# Patient Record
Sex: Female | Born: 1975
Health system: Southern US, Community
[De-identification: ages and names within clinical notes are randomized; demographics above are authoritative.]

## PROBLEM LIST (undated history)

## (undated) HISTORY — PX: OTHER SURGICAL HISTORY: SHX169

---

## 1999-11-06 ENCOUNTER — Ambulatory Visit (HOSPITAL_COMMUNITY): Admission: RE | Admit: 1999-11-06 | Discharge: 1999-11-06 | Payer: Self-pay | Admitting: Gastroenterology

## 2004-10-08 ENCOUNTER — Ambulatory Visit: Payer: Self-pay | Admitting: Internal Medicine

## 2006-12-26 ENCOUNTER — Ambulatory Visit: Payer: Self-pay | Admitting: Internal Medicine

## 2007-04-07 ENCOUNTER — Ambulatory Visit: Payer: Self-pay | Admitting: Obstetrics and Gynecology

## 2007-04-13 ENCOUNTER — Ambulatory Visit: Payer: Self-pay | Admitting: Obstetrics and Gynecology

## 2009-10-05 ENCOUNTER — Ambulatory Visit: Payer: Self-pay | Admitting: Internal Medicine

## 2010-11-06 ENCOUNTER — Ambulatory Visit: Payer: Self-pay

## 2013-04-15 ENCOUNTER — Ambulatory Visit: Payer: Self-pay | Admitting: Internal Medicine

## 2014-10-05 DIAGNOSIS — B977 Papillomavirus as the cause of diseases classified elsewhere: Secondary | ICD-10-CM | POA: Insufficient documentation

## 2014-10-05 DIAGNOSIS — A6000 Herpesviral infection of urogenital system, unspecified: Secondary | ICD-10-CM | POA: Insufficient documentation

## 2016-02-14 ENCOUNTER — Ambulatory Visit (INDEPENDENT_AMBULATORY_CARE_PROVIDER_SITE_OTHER): Payer: No Typology Code available for payment source | Admitting: Podiatry

## 2016-02-14 ENCOUNTER — Encounter: Payer: Self-pay | Admitting: Podiatry

## 2016-02-14 ENCOUNTER — Ambulatory Visit (INDEPENDENT_AMBULATORY_CARE_PROVIDER_SITE_OTHER): Payer: No Typology Code available for payment source

## 2016-02-14 VITALS — BP 102/61 | HR 64 | Resp 16 | Ht 63.0 in | Wt 175.0 lb

## 2016-02-14 DIAGNOSIS — M722 Plantar fascial fibromatosis: Secondary | ICD-10-CM

## 2016-02-14 MED ORDER — METHYLPREDNISOLONE 4 MG PO TBPK: ORAL_TABLET | ORAL | 0 refills | Status: DC

## 2016-02-14 MED ORDER — MELOXICAM 15 MG PO TABS: 15.0000 mg | ORAL_TABLET | Freq: Every day | ORAL | 3 refills | Status: DC

## 2016-02-14 NOTE — Patient Instructions (Signed)

## 2016-02-14 NOTE — Progress Notes (Signed)
   Subjective:    Patient ID: Jo Hampton, female    DOB: 1975/11/03, 40 y.o.   MRN: [id number]  HPI: She presents today with a chief complaint of pain to the right heel times the past 5-6 months. She states that as a dull ache hurts worse in the mornings. She tried rolling frozen water bottles with some relief. She denies any trauma.    Review of Systems  All other systems reviewed and are negative.      Objective:   Physical Exam: Vital signs are stable alert and oriented 3. Pulses are palpable. Neurologic sensorium is intact. Reflexes are intact. Muscle strength +5 over 5 as a flexed plantar flexors and inverters everters all just musculature is intact. Orthopedic evaluation and the straight awl just distal to the ankle for range of motion without crepitation. She does have pain on palpation medial calcaneal tubercle of the right heel very sensitive. 3 views radiographs taken today in the office of the right foot demonstrates osseously mature individual with the known major osseous abnormalities. No fractures are identified. Soft tissue density is increased at the plantar fascia calcaneal insertion site.        Assessment & Plan:  Assessment: Plantar fasciitis right foot.  Plan: Initiated treatment with injection to the right heel today with Kenalog and local anesthetic started her on a Medrol Dosepak to be followed by meloxicam. Question plantar fascia brace to be followed by a night splint discussed appropriate shoe gear stretching exercises ice therapy and shoe gear modifications. Follow-up with her 1 month

## 2016-03-13 ENCOUNTER — Encounter: Payer: Self-pay | Admitting: Podiatry

## 2016-03-13 ENCOUNTER — Ambulatory Visit (INDEPENDENT_AMBULATORY_CARE_PROVIDER_SITE_OTHER): Payer: No Typology Code available for payment source | Admitting: Podiatry

## 2016-03-13 DIAGNOSIS — M722 Plantar fascial fibromatosis: Secondary | ICD-10-CM

## 2016-03-13 NOTE — Progress Notes (Signed)
He presents today for one-month follow-up for her fascitis right foot. States it is doing better that the pain is started to regress. She states the shot helped considerably and now the pain is starting back in. She states that she is wearing the brace and the night splint and taking medication.  Objective: Vital signs are stable she is alert and oriented 3 she has pain on palpation medial calcaneal tubercle of the right foot. Pain pulses remain palpable.  Assessment: Plantar fasciitis right.  Plan: Injected the area today with Kenalog and local anesthetic. Follow-up with her in 1 month at which time we will consider orthotics.

## 2016-06-12 ENCOUNTER — Ambulatory Visit: Payer: No Typology Code available for payment source | Admitting: Podiatry

## 2016-06-17 ENCOUNTER — Ambulatory Visit: Payer: No Typology Code available for payment source | Admitting: Podiatry

## 2016-07-17 DIAGNOSIS — M722 Plantar fascial fibromatosis: Secondary | ICD-10-CM | POA: Diagnosis not present

## 2016-08-19 DIAGNOSIS — M722 Plantar fascial fibromatosis: Secondary | ICD-10-CM | POA: Diagnosis not present

## 2016-08-26 DIAGNOSIS — R319 Hematuria, unspecified: Secondary | ICD-10-CM | POA: Diagnosis not present

## 2016-08-26 DIAGNOSIS — N390 Urinary tract infection, site not specified: Secondary | ICD-10-CM | POA: Diagnosis not present

## 2016-09-10 DIAGNOSIS — L309 Dermatitis, unspecified: Secondary | ICD-10-CM | POA: Diagnosis not present

## 2016-09-25 DIAGNOSIS — M25512 Pain in left shoulder: Secondary | ICD-10-CM | POA: Diagnosis not present

## 2016-09-25 DIAGNOSIS — M25461 Effusion, right knee: Secondary | ICD-10-CM | POA: Diagnosis not present

## 2016-09-30 DIAGNOSIS — M4727 Other spondylosis with radiculopathy, lumbosacral region: Secondary | ICD-10-CM | POA: Diagnosis not present

## 2016-10-10 DIAGNOSIS — M25561 Pain in right knee: Secondary | ICD-10-CM | POA: Diagnosis not present

## 2016-10-10 DIAGNOSIS — M545 Low back pain: Secondary | ICD-10-CM | POA: Diagnosis not present

## 2016-10-10 DIAGNOSIS — M25461 Effusion, right knee: Secondary | ICD-10-CM | POA: Diagnosis not present

## 2016-10-10 DIAGNOSIS — M67461 Ganglion, right knee: Secondary | ICD-10-CM | POA: Diagnosis not present

## 2016-10-10 DIAGNOSIS — M25512 Pain in left shoulder: Secondary | ICD-10-CM | POA: Diagnosis not present

## 2016-10-10 DIAGNOSIS — M75112 Incomplete rotator cuff tear or rupture of left shoulder, not specified as traumatic: Secondary | ICD-10-CM | POA: Diagnosis not present

## 2016-10-10 DIAGNOSIS — M5416 Radiculopathy, lumbar region: Secondary | ICD-10-CM | POA: Diagnosis not present

## 2016-10-10 DIAGNOSIS — S83221A Peripheral tear of medial meniscus, current injury, right knee, initial encounter: Secondary | ICD-10-CM | POA: Diagnosis not present

## 2016-10-10 DIAGNOSIS — M75102 Unspecified rotator cuff tear or rupture of left shoulder, not specified as traumatic: Secondary | ICD-10-CM | POA: Diagnosis not present

## 2016-10-10 DIAGNOSIS — M5126 Other intervertebral disc displacement, lumbar region: Secondary | ICD-10-CM | POA: Diagnosis not present

## 2016-10-10 DIAGNOSIS — M7582 Other shoulder lesions, left shoulder: Secondary | ICD-10-CM | POA: Diagnosis not present

## 2016-10-10 DIAGNOSIS — M47816 Spondylosis without myelopathy or radiculopathy, lumbar region: Secondary | ICD-10-CM | POA: Diagnosis not present

## 2016-10-11 DIAGNOSIS — M25561 Pain in right knee: Secondary | ICD-10-CM | POA: Diagnosis not present

## 2016-10-18 DIAGNOSIS — M75112 Incomplete rotator cuff tear or rupture of left shoulder, not specified as traumatic: Secondary | ICD-10-CM | POA: Diagnosis not present

## 2016-10-18 DIAGNOSIS — M25512 Pain in left shoulder: Secondary | ICD-10-CM | POA: Diagnosis not present

## 2016-10-18 DIAGNOSIS — S83231A Complex tear of medial meniscus, current injury, right knee, initial encounter: Secondary | ICD-10-CM | POA: Diagnosis not present

## 2016-11-20 DIAGNOSIS — S56911A Strain of unspecified muscles, fascia and tendons at forearm level, right arm, initial encounter: Secondary | ICD-10-CM | POA: Insufficient documentation

## 2016-11-20 DIAGNOSIS — M7542 Impingement syndrome of left shoulder: Secondary | ICD-10-CM | POA: Insufficient documentation

## 2016-11-20 DIAGNOSIS — M25531 Pain in right wrist: Secondary | ICD-10-CM | POA: Diagnosis not present

## 2016-12-10 DIAGNOSIS — M79631 Pain in right forearm: Secondary | ICD-10-CM | POA: Diagnosis not present

## 2016-12-10 DIAGNOSIS — M25512 Pain in left shoulder: Secondary | ICD-10-CM | POA: Diagnosis not present

## 2016-12-16 DIAGNOSIS — M722 Plantar fascial fibromatosis: Secondary | ICD-10-CM | POA: Diagnosis not present

## 2016-12-20 DIAGNOSIS — M25512 Pain in left shoulder: Secondary | ICD-10-CM | POA: Diagnosis not present

## 2016-12-20 DIAGNOSIS — M79631 Pain in right forearm: Secondary | ICD-10-CM | POA: Diagnosis not present

## 2016-12-23 DIAGNOSIS — M6702 Short Achilles tendon (acquired), left ankle: Secondary | ICD-10-CM | POA: Diagnosis not present

## 2016-12-23 DIAGNOSIS — M722 Plantar fascial fibromatosis: Secondary | ICD-10-CM | POA: Diagnosis not present

## 2016-12-23 DIAGNOSIS — M84375A Stress fracture, left foot, initial encounter for fracture: Secondary | ICD-10-CM | POA: Insufficient documentation

## 2016-12-23 DIAGNOSIS — M79671 Pain in right foot: Secondary | ICD-10-CM | POA: Diagnosis not present

## 2016-12-23 DIAGNOSIS — M79672 Pain in left foot: Secondary | ICD-10-CM | POA: Diagnosis not present

## 2016-12-24 DIAGNOSIS — M79672 Pain in left foot: Secondary | ICD-10-CM | POA: Diagnosis not present

## 2016-12-24 DIAGNOSIS — M6702 Short Achilles tendon (acquired), left ankle: Secondary | ICD-10-CM | POA: Diagnosis not present

## 2016-12-24 DIAGNOSIS — M722 Plantar fascial fibromatosis: Secondary | ICD-10-CM | POA: Diagnosis not present

## 2016-12-24 DIAGNOSIS — M79671 Pain in right foot: Secondary | ICD-10-CM | POA: Diagnosis not present

## 2016-12-26 DIAGNOSIS — M25512 Pain in left shoulder: Secondary | ICD-10-CM | POA: Diagnosis not present

## 2016-12-26 DIAGNOSIS — Z01419 Encounter for gynecological examination (general) (routine) without abnormal findings: Secondary | ICD-10-CM | POA: Diagnosis not present

## 2016-12-26 DIAGNOSIS — M79631 Pain in right forearm: Secondary | ICD-10-CM | POA: Diagnosis not present

## 2017-01-02 DIAGNOSIS — M65872 Other synovitis and tenosynovitis, left ankle and foot: Secondary | ICD-10-CM | POA: Diagnosis not present

## 2017-01-02 DIAGNOSIS — M79672 Pain in left foot: Secondary | ICD-10-CM | POA: Diagnosis not present

## 2017-01-02 DIAGNOSIS — M722 Plantar fascial fibromatosis: Secondary | ICD-10-CM | POA: Diagnosis not present

## 2017-01-02 DIAGNOSIS — M84375A Stress fracture, left foot, initial encounter for fracture: Secondary | ICD-10-CM | POA: Diagnosis not present

## 2017-01-02 DIAGNOSIS — M6702 Short Achilles tendon (acquired), left ankle: Secondary | ICD-10-CM | POA: Diagnosis not present

## 2017-01-02 DIAGNOSIS — M79671 Pain in right foot: Secondary | ICD-10-CM | POA: Diagnosis not present

## 2017-01-10 DIAGNOSIS — M79672 Pain in left foot: Secondary | ICD-10-CM | POA: Diagnosis not present

## 2017-01-10 DIAGNOSIS — M79671 Pain in right foot: Secondary | ICD-10-CM | POA: Diagnosis not present

## 2017-01-10 DIAGNOSIS — G5750 Tarsal tunnel syndrome, unspecified lower limb: Secondary | ICD-10-CM | POA: Diagnosis not present

## 2017-01-15 DIAGNOSIS — M84375A Stress fracture, left foot, initial encounter for fracture: Secondary | ICD-10-CM | POA: Diagnosis not present

## 2017-01-15 DIAGNOSIS — M6702 Short Achilles tendon (acquired), left ankle: Secondary | ICD-10-CM | POA: Diagnosis not present

## 2017-01-15 DIAGNOSIS — M722 Plantar fascial fibromatosis: Secondary | ICD-10-CM | POA: Diagnosis not present

## 2017-01-28 DIAGNOSIS — M25512 Pain in left shoulder: Secondary | ICD-10-CM | POA: Diagnosis not present

## 2017-01-28 DIAGNOSIS — M79631 Pain in right forearm: Secondary | ICD-10-CM | POA: Diagnosis not present

## 2017-02-11 DIAGNOSIS — R635 Abnormal weight gain: Secondary | ICD-10-CM | POA: Diagnosis not present

## 2017-02-11 DIAGNOSIS — F331 Major depressive disorder, recurrent, moderate: Secondary | ICD-10-CM | POA: Diagnosis not present

## 2017-02-11 DIAGNOSIS — M064 Inflammatory polyarthropathy: Secondary | ICD-10-CM | POA: Diagnosis not present

## 2017-02-11 DIAGNOSIS — R5383 Other fatigue: Secondary | ICD-10-CM | POA: Diagnosis not present

## 2017-02-19 DIAGNOSIS — M25512 Pain in left shoulder: Secondary | ICD-10-CM | POA: Diagnosis not present

## 2017-02-19 DIAGNOSIS — M79631 Pain in right forearm: Secondary | ICD-10-CM | POA: Diagnosis not present

## 2017-02-19 DIAGNOSIS — M722 Plantar fascial fibromatosis: Secondary | ICD-10-CM | POA: Diagnosis not present

## 2017-03-06 DIAGNOSIS — M722 Plantar fascial fibromatosis: Secondary | ICD-10-CM | POA: Diagnosis not present

## 2017-03-06 DIAGNOSIS — M25512 Pain in left shoulder: Secondary | ICD-10-CM | POA: Diagnosis not present

## 2017-03-06 DIAGNOSIS — M79631 Pain in right forearm: Secondary | ICD-10-CM | POA: Diagnosis not present

## 2017-03-07 ENCOUNTER — Other Ambulatory Visit: Payer: Self-pay | Admitting: Internal Medicine

## 2017-03-07 DIAGNOSIS — E782 Mixed hyperlipidemia: Secondary | ICD-10-CM | POA: Diagnosis not present

## 2017-03-07 DIAGNOSIS — R5383 Other fatigue: Secondary | ICD-10-CM | POA: Diagnosis not present

## 2017-03-07 DIAGNOSIS — M25512 Pain in left shoulder: Secondary | ICD-10-CM | POA: Diagnosis not present

## 2017-03-07 DIAGNOSIS — E669 Obesity, unspecified: Secondary | ICD-10-CM | POA: Diagnosis not present

## 2017-03-07 DIAGNOSIS — E559 Vitamin D deficiency, unspecified: Secondary | ICD-10-CM | POA: Diagnosis not present

## 2017-03-07 DIAGNOSIS — M79631 Pain in right forearm: Secondary | ICD-10-CM | POA: Diagnosis not present

## 2017-03-07 DIAGNOSIS — E079 Disorder of thyroid, unspecified: Secondary | ICD-10-CM | POA: Diagnosis not present

## 2017-03-07 DIAGNOSIS — M722 Plantar fascial fibromatosis: Secondary | ICD-10-CM | POA: Diagnosis not present

## 2017-03-07 DIAGNOSIS — D691 Qualitative platelet defects: Secondary | ICD-10-CM | POA: Diagnosis not present

## 2017-03-19 DIAGNOSIS — M79631 Pain in right forearm: Secondary | ICD-10-CM | POA: Diagnosis not present

## 2017-03-19 DIAGNOSIS — M722 Plantar fascial fibromatosis: Secondary | ICD-10-CM | POA: Diagnosis not present

## 2017-03-19 DIAGNOSIS — M25512 Pain in left shoulder: Secondary | ICD-10-CM | POA: Diagnosis not present

## 2017-03-28 DIAGNOSIS — M25512 Pain in left shoulder: Secondary | ICD-10-CM | POA: Diagnosis not present

## 2017-03-28 DIAGNOSIS — M79631 Pain in right forearm: Secondary | ICD-10-CM | POA: Diagnosis not present

## 2017-03-28 DIAGNOSIS — M722 Plantar fascial fibromatosis: Secondary | ICD-10-CM | POA: Diagnosis not present

## 2017-03-28 LAB — TSH: TSH: 2.77 u[IU]/mL (ref 0.450–4.500)

## 2017-03-28 LAB — CBC
Hematocrit: 39.1 % (ref 34.0–46.6)
Hemoglobin: 13.8 g/dL (ref 11.1–15.9)
MCH: 30.8 pg (ref 26.6–33.0)
MCHC: 35.3 g/dL (ref 31.5–35.7)
MCV: 87 fL (ref 79–97)
PLATELETS: 315 10*3/uL (ref 150–379)
RBC: 4.48 x10E6/uL (ref 3.77–5.28)
RDW: 12.8 % (ref 12.3–15.4)
WBC: 6.3 10*3/uL (ref 3.4–10.8)

## 2017-03-28 LAB — COMPREHENSIVE METABOLIC PANEL
A/G RATIO: 1.6 (ref 1.2–2.2)
ALBUMIN: 4.2 g/dL (ref 3.5–5.5)
ALT: 13 IU/L (ref 0–32)
AST: 19 IU/L (ref 0–40)
Alkaline Phosphatase: 65 IU/L (ref 39–117)
BUN / CREAT RATIO: 13 (ref 9–23)
BUN: 13 mg/dL (ref 6–24)
Bilirubin Total: 0.3 mg/dL (ref 0.0–1.2)
CALCIUM: 8.9 mg/dL (ref 8.7–10.2)
CO2: 22 mmol/L (ref 20–29)
CREATININE: 1.03 mg/dL — AB (ref 0.57–1.00)
Chloride: 105 mmol/L (ref 96–106)
GFR, EST AFRICAN AMERICAN: 78 mL/min/{1.73_m2} (ref >59)
GFR, EST NON AFRICAN AMERICAN: 68 mL/min/{1.73_m2} (ref >59)
GLOBULIN, TOTAL: 2.6 g/dL (ref 1.5–4.5)
Glucose: 98 mg/dL (ref 65–99)
POTASSIUM: 4.3 mmol/L (ref 3.5–5.2)
SODIUM: 141 mmol/L (ref 134–144)
TOTAL PROTEIN: 6.8 g/dL (ref 6.0–8.5)

## 2017-03-28 LAB — T4, FREE: Free T4: 1.06 ng/dL (ref 0.82–1.77)

## 2017-03-28 LAB — VITAMIN D 25 HYDROXY (VIT D DEFICIENCY, FRACTURES): VIT D 25 HYDROXY: 15.7 ng/mL — AB (ref 30.0–100.0)

## 2017-03-28 LAB — LIPID PANEL W/O CHOL/HDL RATIO
CHOLESTEROL TOTAL: 179 mg/dL (ref 100–199)
HDL: 42 mg/dL (ref >39)
LDL Calculated: 118 mg/dL — ABNORMAL HIGH (ref 0–99)
Triglycerides: 96 mg/dL (ref 0–149)
VLDL Cholesterol Cal: 19 mg/dL (ref 5–40)

## 2017-04-02 DIAGNOSIS — M722 Plantar fascial fibromatosis: Secondary | ICD-10-CM | POA: Diagnosis not present

## 2017-04-02 DIAGNOSIS — M25512 Pain in left shoulder: Secondary | ICD-10-CM | POA: Diagnosis not present

## 2017-04-02 DIAGNOSIS — M79631 Pain in right forearm: Secondary | ICD-10-CM | POA: Diagnosis not present

## 2017-04-03 ENCOUNTER — Other Ambulatory Visit: Payer: Self-pay | Admitting: Gynecology

## 2017-04-03 DIAGNOSIS — Z1231 Encounter for screening mammogram for malignant neoplasm of breast: Secondary | ICD-10-CM

## 2017-04-03 DIAGNOSIS — M79642 Pain in left hand: Secondary | ICD-10-CM | POA: Diagnosis not present

## 2017-04-10 ENCOUNTER — Ambulatory Visit: Payer: Self-pay | Admitting: Nurse Practitioner

## 2017-04-14 DIAGNOSIS — M722 Plantar fascial fibromatosis: Secondary | ICD-10-CM | POA: Diagnosis not present

## 2017-04-23 ENCOUNTER — Other Ambulatory Visit: Payer: Self-pay | Admitting: Gynecology

## 2017-04-23 ENCOUNTER — Ambulatory Visit
Admission: RE | Admit: 2017-04-23 | Discharge: 2017-04-23 | Disposition: A | Discharge status: Home / Self Care | Payer: BLUE CROSS/BLUE SHIELD | Source: Ambulatory Visit | Attending: Internal Medicine | Admitting: Internal Medicine

## 2017-04-23 DIAGNOSIS — Z1231 Encounter for screening mammogram for malignant neoplasm of breast: Secondary | ICD-10-CM

## 2017-04-29 DIAGNOSIS — M722 Plantar fascial fibromatosis: Secondary | ICD-10-CM | POA: Diagnosis not present

## 2017-04-29 DIAGNOSIS — M79631 Pain in right forearm: Secondary | ICD-10-CM | POA: Diagnosis not present

## 2017-04-29 DIAGNOSIS — G8929 Other chronic pain: Secondary | ICD-10-CM | POA: Diagnosis not present

## 2017-04-29 DIAGNOSIS — M25512 Pain in left shoulder: Secondary | ICD-10-CM | POA: Diagnosis not present

## 2017-04-29 DIAGNOSIS — M545 Low back pain: Secondary | ICD-10-CM | POA: Diagnosis not present

## 2017-04-30 DIAGNOSIS — G8929 Other chronic pain: Secondary | ICD-10-CM | POA: Diagnosis not present

## 2017-04-30 DIAGNOSIS — M79631 Pain in right forearm: Secondary | ICD-10-CM | POA: Diagnosis not present

## 2017-04-30 DIAGNOSIS — M25512 Pain in left shoulder: Secondary | ICD-10-CM | POA: Diagnosis not present

## 2017-04-30 DIAGNOSIS — M722 Plantar fascial fibromatosis: Secondary | ICD-10-CM | POA: Diagnosis not present

## 2017-04-30 DIAGNOSIS — M545 Low back pain: Secondary | ICD-10-CM | POA: Diagnosis not present

## 2017-05-05 ENCOUNTER — Encounter: Payer: Self-pay | Admitting: Nurse Practitioner

## 2017-05-05 ENCOUNTER — Ambulatory Visit: Payer: BLUE CROSS/BLUE SHIELD | Admitting: Nurse Practitioner

## 2017-05-05 VITALS — BP 127/71 | HR 60 | Resp 16 | Ht 63.0 in | Wt 188.0 lb

## 2017-05-05 DIAGNOSIS — K649 Unspecified hemorrhoids: Secondary | ICD-10-CM | POA: Diagnosis not present

## 2017-05-05 DIAGNOSIS — M064 Inflammatory polyarthropathy: Secondary | ICD-10-CM | POA: Diagnosis not present

## 2017-05-05 DIAGNOSIS — E668 Other obesity: Secondary | ICD-10-CM

## 2017-05-05 MED ORDER — ERGOCALCIFEROL 1.25 MG (50000 UT) PO CAPS: 50000.0000 [IU] | ORAL_CAPSULE | ORAL | 5 refills | Status: DC

## 2017-05-05 MED ORDER — HYDROCORTISONE ACE-PRAMOXINE 2.5-1 & 1 % RE KIT: 1.0000 | PACK | Freq: Two times a day (BID) | RECTAL | 3 refills | Status: DC | PRN

## 2017-05-05 MED ORDER — MELOXICAM 15 MG PO TABS: 15.0000 mg | ORAL_TABLET | Freq: Every day | ORAL | 3 refills | Status: DC

## 2017-05-05 MED ORDER — PHENDIMETRAZINE TARTRATE ER 105 MG PO CP24: 105.0000 mg | ORAL_CAPSULE | Freq: Every day | ORAL | 1 refills | Status: DC

## 2017-05-05 NOTE — Progress Notes (Signed)
[facility] [place], [place] [address]  Internal MEDICINE  Office Visit Note  Patient Name: Jo Hampton  [id number]  [id number]  Date of Service: 05/17/2017  Chief Complaint  Patient presents with  . Osteoarthritis    multiple joints    The patient is here for routine follow up exam. At her last visit, she c/o joint and tendon pain in multiple areas. Seeing orthopedics, hand specialist, and podiatrist. She is going to have procedure on her left foot to relieve plantar fasciitis. She continues to have pain in her foot, her hands, and her lower back. Was taking meloxicam 56m daily for her foot, but has not taken it for joint pain in other areas. Did try low dose duloxetine at her last visit. She feels like it was making her more irritable and not helping with overall joint pain. She was started on phentermine at her last visit. Really had trouble with increased physical activity. Was constantly getting hungry around dinner time. Did not lose weight, in fact, gained one pound. Would like to try different medication. Knows this will be difficult, as she willl be in immobilizer for 2 weeks after the procedure on her foot.     Pt is here for routine follow up.    Current Medication: Outpatient Encounter Medications as of 05/05/2017  Medication Sig Note  . COPPER PO by Intrauterine route. 02/14/2016: Received from: [place] 1 each by Intrauterine route once.  . meloxicam (MOBIC) 15 MG tablet Take 1 tablet (15 mg total) by mouth daily.   . [DISCONTINUED] DULoxetine (CYMBALTA) 20 MG capsule Take 20 mg by mouth daily.   . [DISCONTINUED] meloxicam (MOBIC) 15 MG tablet Take 1 tablet (15 mg total) by mouth daily.   . [DISCONTINUED] phentermine (ADIPEX-P) 37.5 MG tablet Take 37.5 mg by mouth daily.   . B Complex Vitamins (VITAMIN B COMPLEX PO) Take by mouth. 02/14/2016: Received from: [place] Take 1 capsule by mouth daily.  .  ergocalciferol (DRISDOL) 50000 units capsule Take 1 capsule (50,000 Units total) by mouth once a week.   . Hydrocortisone Ace-Pramoxine 2.5-1 & 1 % KIT Place 1 suppository rectally 2 (two) times daily as needed. (Patient taking differently: Place rectally 2 (two) times daily as needed. )   . Phendimetrazine Tartrate 105 MG CP24 Take 1 capsule (105 mg total) by mouth daily.    No facility-administered encounter medications on file as of 05/05/2017.     Surgical History: Past Surgical History:  Procedure Laterality Date  . ovarian  cyst removed      Medical History: No past medical history on file.  Family History: Family History  Problem Relation Age of Onset  . Breast cancer Mother 436 . Depression Mother     Social History   Socioeconomic History  . Marital status: Single    Spouse name: Not on file  . Number of children: Not on file  . Years of education: Not on file  . Highest education level: Not on file  Social Needs  . Financial resource strain: Not on file  . Food insecurity - worry: Not on file  . Food insecurity - inability: Not on file  . Transportation needs - medical: Not on file  . Transportation needs - non-medical: Not on file  Occupational History  . Not on file  Tobacco Use  . Smoking status: Never Smoker  . Smokeless tobacco: Never Used  Substance and Sexual Activity  .  Alcohol use: Yes    Comment: drinks socially  . Drug use: No  . Sexual activity: Not on file  Other Topics Concern  . Not on file  Social History Narrative  . Not on file      Review of Systems  Constitutional: Positive for fatigue and unexpected weight change. Negative for chills.  HENT: Negative for congestion, postnasal drip, rhinorrhea, sneezing and sore throat.   Eyes: Negative.  Negative for redness.  Respiratory: Negative for cough, chest tightness, shortness of breath and wheezing.   Cardiovascular: Negative for chest pain and palpitations.  Gastrointestinal:  Negative for abdominal pain, constipation, diarrhea, nausea and vomiting.  Genitourinary: Negative for dysuria and frequency.  Musculoskeletal: Positive for arthralgias. Negative for back pain, joint swelling and neck pain.       Genrealized joint pain.   Skin: Negative for rash.  Neurological: Negative for tremors, numbness and headaches.  Hematological: Negative for adenopathy. Does not bruise/bleed easily.  Psychiatric/Behavioral: Negative for behavioral problems (Depression), sleep disturbance and suicidal ideas. The patient is nervous/anxious.     Today's Vitals   05/05/17 1356  BP: 127/71  Pulse: 60  Resp: 16  SpO2: 96%  Weight: 188 lb (85.3 kg)  Height: 5' 3"  (1.6 m)    Physical Exam  Constitutional: She is oriented to person, place, and time. She appears well-developed and well-nourished. No distress.  HENT:  Head: Normocephalic and atraumatic.  Mouth/Throat: Oropharynx is clear and moist. No oropharyngeal exudate.  Eyes: EOM are normal. Pupils are equal, round, and reactive to light.  Neck: Normal range of motion. Neck supple. No JVD present. No tracheal deviation present. No thyromegaly present.  Cardiovascular: Normal rate, regular rhythm and normal heart sounds. Exam reveals no gallop and no friction rub.  No murmur heard. Pulmonary/Chest: Effort normal and breath sounds normal. No respiratory distress. She has no wheezes. She has no rales. She exhibits no tenderness.  Abdominal: Soft. Bowel sounds are normal. There is no tenderness.  Musculoskeletal:  Generalized joint pain with point tenderness present. No visible abnormalities or deformities present at this time.   Lymphadenopathy:    She has no cervical adenopathy.  Neurological: She is alert and oriented to person, place, and time. No cranial nerve deficit.  Skin: Skin is warm and dry. She is not diaphoretic.  Psychiatric: She has a normal mood and affect. Her behavior is normal. Judgment and thought content  normal.  Nursing note and vitals reviewed.   Assessment/Plan: 1. Inflammatory polyarthritis ([place]) Connective tissue labs normal. Will refer to rheumatology for further evaluation and treatment of generalized joint pain.  - Ambulatory referral to Rheumatology - meloxicam (MOBIC) 15 MG tablet; Take 1 tablet (15 mg total) by mouth daily.  Dispense: 30 tablet; Refill: 3  2. Moderate obesity Try round of phendimettrizine SR 137m daily. Limit calorie intake to 1200 caloreis per day.  calories per day and  - ergocalciferol (DRISDOL) 50000 units capsule; Take 1 capsule (50,000 Units total) by mouth once a week.  Dispense: 4 capsule; Refill: 5 - Phendimetrazine Tartrate 105 MG CP24; Take 1 capsule (105 mg total) by mouth daily.  Dispense: 30 each; Refill: 1  3. Hemorrhoids, unspecified hemorrhoid type - Hydrocortisone Ace-Pramoxine 2.5-1 & 1 % KIT; Place 1 suppository rectally 2 (two) times daily as needed. (Patient taking differently: Place rectally 2 (two) times daily as needed. )  Dispense: 1 kit; Refill: 3  General Counseling: Anokhi verbalizes understanding of the findings of todays visit and agrees with plan  of treatment. I have discussed any further diagnostic evaluation that may be needed or ordered today. We also reviewed her medications today. she has been encouraged to call the office with any questions or concerns that should arise related to todays visit.   There is a liability release in patients' chart. There has been a 10 minute discussion about the side effects including but not limited to elevated blood pressure, anxiety, lack of sleep and dry mouth. Pt understands and will like to start/continue on appetite suppressant at this time. There will be one month RX given at the time of visit with proper follow up. Nova diet plan with restricted calories is given to the pt. Pt understands and agrees with  plan of treatment  This patient was seen by Leretha Pol, FNP- C in Collaboration  with Dr Lavera Guise as a part of collaborative care agreement   Orders Placed This Encounter  Procedures  . Ambulatory referral to Rheumatology    Meds ordered this encounter  Medications  . ergocalciferol (DRISDOL) 50000 units capsule    Sig: Take 1 capsule (50,000 Units total) by mouth once a week.    Dispense:  4 capsule    Refill:  5    Order Specific Question:   Supervising Provider    Answer:   Lavera Guise [[id number]]  . meloxicam (MOBIC) 15 MG tablet    Sig: Take 1 tablet (15 mg total) by mouth daily.    Dispense:  30 tablet    Refill:  3    Order Specific Question:   Supervising Provider    Answer:   Lavera Guise [[id number]]  . Phendimetrazine Tartrate 105 MG CP24    Sig: Take 1 capsule (105 mg total) by mouth daily.    Dispense:  30 each    Refill:  1    Order Specific Question:   Supervising Provider    Answer:   Lavera Guise [[id number]]  . Hydrocortisone Ace-Pramoxine 2.5-1 & 1 % KIT    Sig: Place 1 suppository rectally 2 (two) times daily as needed.    Dispense:  1 kit    Refill:  3    Order Specific Question:   Supervising Provider    Answer:   Lavera Guise [[id number]]    Time spent: 97 Minutes     Dr Lavera Guise Internal medicine

## 2017-05-08 ENCOUNTER — Other Ambulatory Visit: Payer: Self-pay

## 2017-05-08 DIAGNOSIS — M722 Plantar fascial fibromatosis: Secondary | ICD-10-CM | POA: Diagnosis not present

## 2017-05-15 ENCOUNTER — Other Ambulatory Visit: Payer: Self-pay

## 2017-05-18 DIAGNOSIS — E668 Other obesity: Secondary | ICD-10-CM | POA: Insufficient documentation

## 2017-05-18 DIAGNOSIS — K649 Unspecified hemorrhoids: Secondary | ICD-10-CM | POA: Insufficient documentation

## 2017-05-18 DIAGNOSIS — M064 Inflammatory polyarthropathy: Secondary | ICD-10-CM | POA: Insufficient documentation

## 2017-05-28 ENCOUNTER — Other Ambulatory Visit: Payer: Self-pay | Admitting: Nurse Practitioner

## 2017-05-28 ENCOUNTER — Telehealth: Payer: Self-pay

## 2017-05-28 DIAGNOSIS — M722 Plantar fascial fibromatosis: Secondary | ICD-10-CM | POA: Diagnosis not present

## 2017-05-28 DIAGNOSIS — M545 Low back pain: Secondary | ICD-10-CM | POA: Diagnosis not present

## 2017-05-28 DIAGNOSIS — E668 Other obesity: Secondary | ICD-10-CM

## 2017-05-28 DIAGNOSIS — G8929 Other chronic pain: Secondary | ICD-10-CM | POA: Diagnosis not present

## 2017-05-28 DIAGNOSIS — M25512 Pain in left shoulder: Secondary | ICD-10-CM | POA: Diagnosis not present

## 2017-05-28 MED ORDER — PHENTERMINE HCL 37.5 MG PO TABS
37.5000 mg | ORAL_TABLET | Freq: Every day | ORAL | 1 refills | Status: DC
Start: 2017-05-28 — End: 2017-06-19

## 2017-05-28 NOTE — Progress Notes (Signed)
D/c Bontril as her pharmacy does not carry this. Changed back to phentermine and sent new rx to her pharmacy.

## 2017-05-28 NOTE — Telephone Encounter (Signed)
D/c Bontril as her pharmacy does not carry this. Changed back to phentermine and sent new rx to her pharmacy.

## 2017-06-04 ENCOUNTER — Telehealth: Payer: Self-pay | Admitting: Nurse Practitioner

## 2017-06-04 ENCOUNTER — Other Ambulatory Visit: Payer: Self-pay | Admitting: Nurse Practitioner

## 2017-06-04 DIAGNOSIS — K649 Unspecified hemorrhoids: Secondary | ICD-10-CM

## 2017-06-04 MED ORDER — HYDROCORTISONE ACETATE 2.5 % EX CREA: 1.0000 "application " | TOPICAL_CREAM | Freq: Two times a day (BID) | CUTANEOUS | 3 refills | Status: DC | PRN

## 2017-06-04 NOTE — Telephone Encounter (Signed)
Changed rx for hemorrhoid cream to hydrocortisone acetate cream 2.5% cream per patient request. Sent new rx to her pharmacy.

## 2017-06-04 NOTE — Telephone Encounter (Signed)
Left message advising patient her rx she requested has been sent in [place]

## 2017-06-04 NOTE — Progress Notes (Signed)
Changed rx for hemorrhoid cream to hydrocortisone acetate cream 2.5% cream per patient request. Sent new rx to her pharmacy.

## 2017-06-19 ENCOUNTER — Ambulatory Visit: Payer: BLUE CROSS/BLUE SHIELD | Admitting: Nurse Practitioner

## 2017-06-19 ENCOUNTER — Encounter: Payer: Self-pay | Admitting: Nurse Practitioner

## 2017-06-19 VITALS — BP 108/68 | HR 76 | Resp 16 | Ht 63.0 in | Wt 185.8 lb

## 2017-06-19 DIAGNOSIS — M064 Inflammatory polyarthropathy: Secondary | ICD-10-CM

## 2017-06-19 DIAGNOSIS — M545 Low back pain: Secondary | ICD-10-CM | POA: Diagnosis not present

## 2017-06-19 DIAGNOSIS — E668 Other obesity: Secondary | ICD-10-CM

## 2017-06-19 DIAGNOSIS — M25512 Pain in left shoulder: Secondary | ICD-10-CM | POA: Diagnosis not present

## 2017-06-19 DIAGNOSIS — M79631 Pain in right forearm: Secondary | ICD-10-CM | POA: Diagnosis not present

## 2017-06-19 DIAGNOSIS — G8929 Other chronic pain: Secondary | ICD-10-CM | POA: Diagnosis not present

## 2017-06-19 DIAGNOSIS — M722 Plantar fascial fibromatosis: Secondary | ICD-10-CM | POA: Diagnosis not present

## 2017-06-19 MED ORDER — PHENTERMINE HCL 37.5 MG PO TABS: 37.5000 mg | ORAL_TABLET | Freq: Every day | ORAL | 1 refills | Status: DC

## 2017-06-19 NOTE — Progress Notes (Signed)
[facility] [place], [place] [address]  Internal MEDICINE  Office Visit Note  Patient Name: Jo Hampton  [id number]  [id number]  Date of Service: 07/09/2017    Pt is here for routine follow up.  Chief Complaint  Patient presents with  . Follow-up    weight loss     The patient is currently taking phentermine to help lose weight. She has felt much better thos month than when she first started. She has increased her exercise a little and is doing very well with limiting her calorie intake to 1200 calories per day. She has lost 3 pounds since her most recent visit. She has no complaints of negative side effects.       Current Medication: Outpatient Encounter Medications as of 06/19/2017  Medication Sig Note  . diphenhydrAMINE-APAP, sleep, (NON-ASPIRIN PM EXTRA STRENGTH PO) Take by mouth.   . B Complex Vitamins (VITAMIN B COMPLEX PO) Take by mouth. 02/14/2016: Received from: [place]: Take 1 capsule by mouth daily.  . COPPER PO by Intrauterine route. 02/14/2016: Received from: [place]: 1 each by Intrauterine route once.  . ergocalciferol (DRISDOL) 50000 units capsule Take 1 capsule (50,000 Units total) by mouth once a week.   . Hydrocortisone Acetate 2.5 % CREA Apply 1 application topically 2 (two) times daily as needed.   . meloxicam (MOBIC) 15 MG tablet Take 1 tablet (15 mg total) by mouth daily.   . phentermine (ADIPEX-P) 37.5 MG tablet Take 1 tablet (37.5 mg total) by mouth daily before breakfast.   . [DISCONTINUED] phentermine (ADIPEX-P) 37.5 MG tablet Take 1 tablet (37.5 mg total) by mouth daily before breakfast.    No facility-administered encounter medications on file as of 06/19/2017.     Surgical History: Past Surgical History:  Procedure Laterality Date  . ovarian  cyst removed      Medical History: No past medical history on file.  Family History: Family History  Problem Relation Age of Onset  .  Breast cancer Mother 30  . Depression Mother     Social History   Socioeconomic History  . Marital status: Single    Spouse name: Not on file  . Number of children: Not on file  . Years of education: Not on file  . Highest education level: Not on file  Occupational History  . Not on file  Social Needs  . Financial resource strain: Not on file  . Food insecurity:    Worry: Not on file    Inability: Not on file  . Transportation needs:    Medical: Not on file    Non-medical: Not on file  Tobacco Use  . Smoking status: Never Smoker  . Smokeless tobacco: Never Used  Substance and Sexual Activity  . Alcohol use: Yes    Comment: drinks socially  . Drug use: No  . Sexual activity: Not on file  Lifestyle  . Physical activity:    Days per week: Not on file    Minutes per session: Not on file  . Stress: Not on file  Relationships  . Social connections:    Talks on phone: Not on file    Gets together: Not on file    Attends religious service: Not on file    Active member of club or organization: Not on file    Attends meetings of clubs or organizations: Not on file    Relationship status: Not on file  . Intimate  partner violence:    Fear of current or ex partner: Not on file    Emotionally abused: Not on file    Physically abused: Not on file    Forced sexual activity: Not on file  Other Topics Concern  . Not on file  Social History Narrative  . Not on file      Review of Systems  Constitutional: Positive for fatigue. Negative for chills and unexpected weight change.       Weight loss of 3 pounds since her most recent visit.   HENT: Negative for congestion, postnasal drip, rhinorrhea, sneezing, sore throat and voice change.   Eyes: Negative.  Negative for redness.  Respiratory: Negative for cough, chest tightness, shortness of breath and wheezing.   Cardiovascular: Negative for chest pain and palpitations.  Gastrointestinal: Negative for abdominal pain,  constipation, diarrhea, nausea and vomiting.  Endocrine: Negative for cold intolerance, heat intolerance, polydipsia, polyphagia and polyuria.  Genitourinary: Negative for dysuria and frequency.  Musculoskeletal: Positive for arthralgias and myalgias. Negative for back pain, joint swelling and neck pain.       Genrealized joint pain.   Skin: Negative for rash.  Allergic/Immunologic: Negative for environmental allergies.  Neurological: Negative for tremors, numbness and headaches.  Hematological: Negative for adenopathy. Does not bruise/bleed easily.  Psychiatric/Behavioral: Negative for behavioral problems (Depression), sleep disturbance and suicidal ideas. The patient is nervous/anxious.     Today's Vitals   06/19/17 1600  BP: 108/68  Pulse: 76  Resp: 16  SpO2: 98%  Weight: 185 lb 12.8 oz (84.3 kg)  Height: 5' 3"  (1.6 m)    Physical Exam  Constitutional: She is oriented to person, place, and time. She appears well-developed and well-nourished. No distress.  HENT:  Head: Normocephalic and atraumatic.  Nose: Nose normal.  Mouth/Throat: Oropharynx is clear and moist. No oropharyngeal exudate.  Eyes: Pupils are equal, round, and reactive to light. Conjunctivae and EOM are normal.  Neck: Normal range of motion. Neck supple. No JVD present. No tracheal deviation present. No thyromegaly present.  Cardiovascular: Normal rate, regular rhythm and normal heart sounds. Exam reveals no gallop and no friction rub.  No murmur heard. Pulmonary/Chest: Effort normal and breath sounds normal. No respiratory distress. She has no wheezes. She has no rales. She exhibits no tenderness.  Abdominal: Soft. Bowel sounds are normal. There is no tenderness.  Musculoskeletal:  Generalized joint pain with point tenderness present. No visible abnormalities or deformities present at this time.   Lymphadenopathy:    She has no cervical adenopathy.  Neurological: She is alert and oriented to person, place, and  time. No cranial nerve deficit.  Skin: Skin is warm and dry. Capillary refill takes less than 2 seconds. She is not diaphoretic.  Psychiatric: She has a normal mood and affect. Her behavior is normal. Judgment and thought content normal.  Nursing note and vitals reviewed.  Assessment/Plan:  1. Inflammatory polyarthritis ([facility]) Overall, improved. Continue meloxicam as prescribed. ROM exercises as tolerated.   2. Moderate obesity Continue phentermine. Limit calorie intake to 1200 calories per day. Continue to gradually increase routine physical activity.  - phentermine (ADIPEX-P) 37.5 MG tablet; Take 1 tablet (37.5 mg total) by mouth daily before breakfast.  Dispense: 30 tablet; Refill: 1  General Counseling: Chaunice verbalizes understanding of the findings of todays visit and agrees with plan of treatment. I have discussed any further diagnostic evaluation that may be needed or ordered today. We also reviewed her medications today. she has been encouraged to  call the office with any questions or concerns that should arise related to todays visit.   There is a liability release in patients' chart. There has been a 10 minute discussion about the side effects including but not limited to elevated blood pressure, anxiety, lack of sleep and dry mouth. Pt understands and will like to start/continue on appetite suppressant at this time. There will be one month RX given at the time of visit with proper follow up. Nova diet plan with restricted calories is given to the pt. Pt understands and agrees with  plan of treatment  This patient was seen by Leretha Pol, FNP- C in Collaboration with Dr Lavera Guise as a part of collaborative care agreement  Meds ordered this encounter  Medications  . phentermine (ADIPEX-P) 37.5 MG tablet    Sig: Take 1 tablet (37.5 mg total) by mouth daily before breakfast.    Dispense:  30 tablet    Refill:  1    Order Specific Question:   Supervising Provider    Answer:    Lavera Guise [[id number]]    Time spent: 76 Minutes      Dr Lavera Guise Internal medicine

## 2017-07-14 DIAGNOSIS — M79631 Pain in right forearm: Secondary | ICD-10-CM | POA: Diagnosis not present

## 2017-07-14 DIAGNOSIS — G8929 Other chronic pain: Secondary | ICD-10-CM | POA: Diagnosis not present

## 2017-07-14 DIAGNOSIS — M722 Plantar fascial fibromatosis: Secondary | ICD-10-CM | POA: Diagnosis not present

## 2017-07-14 DIAGNOSIS — M545 Low back pain: Secondary | ICD-10-CM | POA: Diagnosis not present

## 2017-07-14 DIAGNOSIS — M25512 Pain in left shoulder: Secondary | ICD-10-CM | POA: Diagnosis not present

## 2017-07-17 DIAGNOSIS — M722 Plantar fascial fibromatosis: Secondary | ICD-10-CM | POA: Diagnosis not present

## 2017-07-17 DIAGNOSIS — M064 Inflammatory polyarthropathy: Secondary | ICD-10-CM | POA: Diagnosis not present

## 2017-07-17 DIAGNOSIS — M2550 Pain in unspecified joint: Secondary | ICD-10-CM | POA: Diagnosis not present

## 2017-07-17 DIAGNOSIS — Z79899 Other long term (current) drug therapy: Secondary | ICD-10-CM | POA: Diagnosis not present

## 2017-07-17 DIAGNOSIS — M85841 Other specified disorders of bone density and structure, right hand: Secondary | ICD-10-CM | POA: Diagnosis not present

## 2017-08-05 ENCOUNTER — Ambulatory Visit: Payer: BLUE CROSS/BLUE SHIELD | Admitting: Nurse Practitioner

## 2017-08-05 ENCOUNTER — Encounter: Payer: Self-pay | Admitting: Nurse Practitioner

## 2017-08-05 VITALS — BP 123/69 | HR 70 | Resp 16 | Ht 63.0 in | Wt 180.2 lb

## 2017-08-05 DIAGNOSIS — E668 Other obesity: Secondary | ICD-10-CM | POA: Diagnosis not present

## 2017-08-05 DIAGNOSIS — M064 Inflammatory polyarthropathy: Secondary | ICD-10-CM | POA: Diagnosis not present

## 2017-08-05 MED ORDER — PHENTERMINE HCL 37.5 MG PO TABS: 37.5000 mg | ORAL_TABLET | Freq: Every day | ORAL | 1 refills | Status: DC

## 2017-08-05 NOTE — Progress Notes (Signed)
[facility] [place], [place] [address]  Internal MEDICINE  Office Visit Note  Patient Name: Jo Hampton  [id number]  [id number]  Date of Service: 10/10/2017   Pt is here for routine follow up.    Chief Complaint  Patient presents with  . Obesity    weight loss     The patient has lost 5 pounds since her most recent visit. Currently on phentermine to help improve appetite. She has had some dry mouth and increased constipation, but otherwise is feeling well.      Current Medication: Outpatient Encounter Medications as of 08/05/2017  Medication Sig Note  . B Complex Vitamins (VITAMIN B COMPLEX PO) Take by mouth. 02/14/2016: Received from: [place]: Take 1 capsule by mouth daily.  . COPPER PO by Intrauterine route. 02/14/2016: Received from: [place]: 1 each by Intrauterine route once.  . diphenhydrAMINE-APAP, sleep, (NON-ASPIRIN PM EXTRA STRENGTH PO) Take by mouth.   . ergocalciferol (DRISDOL) 50000 units capsule Take 1 capsule (50,000 Units total) by mouth once a week.   . Hydrocortisone Acetate 2.5 % CREA Apply 1 application topically 2 (two) times daily as needed.   . meloxicam (MOBIC) 15 MG tablet Take 1 tablet (15 mg total) by mouth daily.   . [DISCONTINUED] phentermine (ADIPEX-P) 37.5 MG tablet Take 1 tablet (37.5 mg total) by mouth daily before breakfast.   . [DISCONTINUED] phentermine (ADIPEX-P) 37.5 MG tablet Take 1 tablet (37.5 mg total) by mouth daily before breakfast.    No facility-administered encounter medications on file as of 08/05/2017.     Surgical History: Past Surgical History:  Procedure Laterality Date  . ovarian  cyst removed      Medical History: History reviewed. No pertinent past medical history.  Family History: Family History  Problem Relation Age of Onset  . Breast cancer Mother 21  . Depression Mother     Social History   Socioeconomic History  . Marital status: Single     Spouse name: Not on file  . Number of children: Not on file  . Years of education: Not on file  . Highest education level: Not on file  Occupational History  . Not on file  Social Needs  . Financial resource strain: Not on file  . Food insecurity:    Worry: Not on file    Inability: Not on file  . Transportation needs:    Medical: Not on file    Non-medical: Not on file  Tobacco Use  . Smoking status: Never Smoker  . Smokeless tobacco: Never Used  Substance and Sexual Activity  . Alcohol use: Yes    Comment: drinks socially  . Drug use: No  . Sexual activity: Not on file  Lifestyle  . Physical activity:    Days per week: Not on file    Minutes per session: Not on file  . Stress: Not on file  Relationships  . Social connections:    Talks on phone: Not on file    Gets together: Not on file    Attends religious service: Not on file    Active member of club or organization: Not on file    Attends meetings of clubs or organizations: Not on file    Relationship status: Not on file  . Intimate partner violence:    Fear of current or ex partner: Not on file    Emotionally abused: Not on file    Physically abused: Not  on file    Forced sexual activity: Not on file  Other Topics Concern  . Not on file  Social History Narrative  . Not on file      Review of Systems  Constitutional: Positive for fatigue. Negative for chills and unexpected weight change.       Weight loss of 5 pounds since her most recent visit.   HENT: Negative for congestion, postnasal drip, rhinorrhea, sneezing, sore throat and voice change.   Eyes: Negative.  Negative for redness.  Respiratory: Negative for cough, chest tightness, shortness of breath and wheezing.   Cardiovascular: Negative for chest pain and palpitations.  Gastrointestinal: Negative for abdominal pain, constipation, diarrhea, nausea and vomiting.  Endocrine: Negative for cold intolerance, heat intolerance, polydipsia, polyphagia and  polyuria.  Genitourinary: Negative for dysuria and frequency.  Musculoskeletal: Positive for arthralgias and myalgias. Negative for back pain, joint swelling and neck pain.       Genrealized joint pain.   Skin: Negative for rash.  Allergic/Immunologic: Negative for environmental allergies.  Neurological: Negative for tremors, numbness and headaches.  Hematological: Negative for adenopathy. Does not bruise/bleed easily.  Psychiatric/Behavioral: Negative for behavioral problems (Depression), sleep disturbance and suicidal ideas. The patient is nervous/anxious.    Today's Vitals   08/05/17 1551  BP: 123/69  Pulse: 70  Resp: 16  SpO2: 97%  Weight: 180 lb 3.2 oz (81.7 kg)  Height: 5' 3"  (1.6 m)    Physical Exam  Constitutional: She is oriented to person, place, and time. She appears well-developed and well-nourished. No distress.  HENT:  Head: Normocephalic and atraumatic.  Nose: Nose normal.  Mouth/Throat: Oropharynx is clear and moist. No oropharyngeal exudate.  Eyes: Pupils are equal, round, and reactive to light. Conjunctivae and EOM are normal.  Neck: Normal range of motion. Neck supple. No JVD present. No tracheal deviation present. No thyromegaly present.  Cardiovascular: Normal rate, regular rhythm and normal heart sounds. Exam reveals no gallop and no friction rub.  No murmur heard. Pulmonary/Chest: Effort normal and breath sounds normal. No respiratory distress. She has no wheezes. She has no rales. She exhibits no tenderness.  Abdominal: Soft. Bowel sounds are normal. There is no tenderness.  Musculoskeletal:  Generalized joint pain with point tenderness present. No visible abnormalities or deformities present at this time.   Lymphadenopathy:    She has no cervical adenopathy.  Neurological: She is alert and oriented to person, place, and time. No cranial nerve deficit.  Skin: Skin is warm and dry. Capillary refill takes less than 2 seconds. She is not diaphoretic.   Psychiatric: She has a normal mood and affect. Her behavior is normal. Judgment and thought content normal.  Nursing note and vitals reviewed.  Assessment/Plan: 1. Inflammatory polyarthritis (HCC) Continue meloxicam as prescribed. Visits with orthopedics and rheumatology should be kept as scheduled.   2. Moderate obesity Improving. May continue phentermine 37.89m daily. Continue to consume a 1200-1500 calorie diet. Participate in regular, low-impact exercise, three to four days per week.   General Counseling: Aleece verbalizes understanding of the findings of todays visit and agrees with plan of treatment. I have discussed any further diagnostic evaluation that may be needed or ordered today. We also reviewed her medications today. she has been encouraged to call the office with any questions or concerns that should arise related to todays visit.    Counseling:   There is a liability release in patients' chart. There has been a 10 minute discussion about the side effects including but  not limited to elevated blood pressure, anxiety, lack of sleep and dry mouth. Pt understands and will like to start/continue on appetite suppressant at this time. There will be one month RX given at the time of visit with proper follow up. Nova diet plan with restricted calories is given to the pt. Pt understands and agrees with  plan of treatment  This patient was seen by Leretha Pol FNP Collaboration with Dr Lavera Guise as a part of collaborative care agreement   Meds ordered this encounter  Medications  . DISCONTD: phentermine (ADIPEX-P) 37.5 MG tablet    Sig: Take 1 tablet (37.5 mg total) by mouth daily before breakfast.    Dispense:  30 tablet    Refill:  1    Order Specific Question:   Supervising Provider    Answer:   Lavera Guise [[id number]]    Time spent: 29 Minutes    Dr Lavera Guise Internal medicine

## 2017-09-09 ENCOUNTER — Other Ambulatory Visit: Payer: Self-pay | Admitting: Nurse Practitioner

## 2017-09-09 MED ORDER — DULOXETINE HCL 20 MG PO CPEP: 20.0000 mg | ORAL_CAPSULE | Freq: Every day | ORAL | 2 refills | Status: DC

## 2017-09-10 ENCOUNTER — Other Ambulatory Visit: Payer: Self-pay | Admitting: Nurse Practitioner

## 2017-09-10 MED ORDER — DULOXETINE HCL 20 MG PO CPEP: 20.0000 mg | ORAL_CAPSULE | Freq: Every day | ORAL | 2 refills | Status: DC

## 2017-09-18 ENCOUNTER — Encounter: Payer: Self-pay | Admitting: Nurse Practitioner

## 2017-09-18 ENCOUNTER — Ambulatory Visit: Payer: BLUE CROSS/BLUE SHIELD | Admitting: Nurse Practitioner

## 2017-09-18 VITALS — BP 115/78 | HR 67 | Resp 16 | Ht 63.0 in | Wt 178.6 lb

## 2017-09-18 DIAGNOSIS — J0140 Acute pansinusitis, unspecified: Secondary | ICD-10-CM

## 2017-09-18 DIAGNOSIS — E668 Other obesity: Secondary | ICD-10-CM

## 2017-09-18 MED ORDER — AZITHROMYCIN 250 MG PO TABS
ORAL_TABLET | ORAL | 0 refills | Status: DC
Start: 2017-09-18 — End: 2017-11-11

## 2017-09-18 MED ORDER — PHENTERMINE HCL 37.5 MG PO TABS: 37.5000 mg | ORAL_TABLET | Freq: Every day | ORAL | 1 refills | Status: DC

## 2017-09-18 NOTE — Progress Notes (Signed)
[facility] [place], [place] [address]  Internal MEDICINE  Office Visit Note  Patient Name: Jo Hampton  [id number]  [id number]  Date of Service: 10/15/2017.   Pt is here for routine follow up.    Chief Complaint  Patient presents with  . Obesity    2 pound weight loss since her last visit.     The patient is currently taking phentermine to help lose weight. She hs lost 2 pounds since her last visit and has lost 10 pounds since starting this weight loss program. She has felt much better thos month than when she first started. She has increased her exercise a little and is doing very well with limiting her calorie intake to 1200 calories per day. She has no negative side effects associated with taking phentermine .      Current Medication: Outpatient Encounter Medications as of 09/18/2017  Medication Sig Note  . COPPER PO by Intrauterine route. 02/14/2016: Received from: [place]: 1 each by Intrauterine route once.  . diphenhydrAMINE-APAP, sleep, (NON-ASPIRIN PM EXTRA STRENGTH PO) Take by mouth.   . DULoxetine (CYMBALTA) 20 MG capsule Take 1 capsule (20 mg total) by mouth daily.   . ergocalciferol (DRISDOL) 50000 units capsule Take 1 capsule (50,000 Units total) by mouth once a week.   . Hydrocortisone Acetate 2.5 % CREA Apply 1 application topically 2 (two) times daily as needed.   . meloxicam (MOBIC) 15 MG tablet Take 1 tablet (15 mg total) by mouth daily.   . phentermine (ADIPEX-P) 37.5 MG tablet Take 1 tablet (37.5 mg total) by mouth daily before breakfast.   . [DISCONTINUED] phentermine (ADIPEX-P) 37.5 MG tablet Take 1 tablet (37.5 mg total) by mouth daily before breakfast.   . azithromycin (ZITHROMAX) 250 MG tablet z-pack - take as directed for 5 days   . B Complex Vitamins (VITAMIN B COMPLEX PO) Take by mouth. 02/14/2016: Received from: [place]: Take 1 capsule by mouth daily.   No facility-administered  encounter medications on file as of 09/18/2017.     Surgical History: Past Surgical History:  Procedure Laterality Date  . ovarian  cyst removed      Medical History: History reviewed. No pertinent past medical history.  Family History: Family History  Problem Relation Age of Onset  . Breast cancer Mother 63  . Depression Mother     Social History   Socioeconomic History  . Marital status: Single    Spouse name: Not on file  . Number of children: Not on file  . Years of education: Not on file  . Highest education level: Not on file  Occupational History  . Not on file  Social Needs  . Financial resource strain: Not on file  . Food insecurity:    Worry: Not on file    Inability: Not on file  . Transportation needs:    Medical: Not on file    Non-medical: Not on file  Tobacco Use  . Smoking status: Never Smoker  . Smokeless tobacco: Never Used  Substance and Sexual Activity  . Alcohol use: Yes    Comment: drinks socially  . Drug use: No  . Sexual activity: Not on file  Lifestyle  . Physical activity:    Days per week: Not on file    Minutes per session: Not on file  . Stress: Not on file  Relationships  . Social connections:    Talks on phone: Not on  file    Gets together: Not on file    Attends religious service: Not on file    Active member of club or organization: Not on file    Attends meetings of clubs or organizations: Not on file    Relationship status: Not on file  . Intimate partner violence:    Fear of current or ex partner: Not on file    Emotionally abused: Not on file    Physically abused: Not on file    Forced sexual activity: Not on file  Other Topics Concern  . Not on file  Social History Narrative  . Not on file      Review of Systems  Constitutional: Positive for fatigue. Negative for chills and unexpected weight change.       Weight loss of 2 pounds since her most recent visit.   HENT: Positive for postnasal drip, rhinorrhea,  sinus pressure and sneezing. Negative for congestion, sore throat and voice change.   Eyes: Negative.  Negative for redness.  Respiratory: Negative for cough, chest tightness, shortness of breath and wheezing.   Cardiovascular: Negative for chest pain and palpitations.  Gastrointestinal: Negative for abdominal pain, constipation, diarrhea, nausea and vomiting.  Endocrine: Negative for cold intolerance, heat intolerance, polydipsia, polyphagia and polyuria.  Genitourinary: Negative for dysuria and frequency.  Musculoskeletal: Positive for arthralgias and myalgias. Negative for back pain, joint swelling and neck pain.       Genrealized joint pain.   Skin: Negative for rash.  Allergic/Immunologic: Negative for environmental allergies.  Neurological: Negative for tremors, numbness and headaches.  Hematological: Negative for adenopathy. Does not bruise/bleed easily.  Psychiatric/Behavioral: Negative for behavioral problems (Depression), sleep disturbance and suicidal ideas. The patient is nervous/anxious.     Today's Vitals   09/18/17 1624  BP: 115/78  Pulse: 67  Resp: 16  SpO2: 100%  Weight: 178 lb 9.6 oz (81 kg)  Height: 5' 3"  (1.6 m)    Physical Exam  Constitutional: She is oriented to person, place, and time. She appears well-developed and well-nourished. No distress.  HENT:  Head: Normocephalic and atraumatic.  Nose: Rhinorrhea present. Right sinus exhibits maxillary sinus tenderness. Left sinus exhibits maxillary sinus tenderness.  Mouth/Throat: Oropharynx is clear and moist. No oropharyngeal exudate.  Eyes: Pupils are equal, round, and reactive to light. Conjunctivae and EOM are normal.  Neck: Normal range of motion. Neck supple. No JVD present. No tracheal deviation present. No thyromegaly present.  Cardiovascular: Normal rate, regular rhythm and normal heart sounds. Exam reveals no gallop and no friction rub.  No murmur heard. Pulmonary/Chest: Effort normal and breath sounds  normal. No respiratory distress. She has no wheezes. She has no rales. She exhibits no tenderness.  Abdominal: Soft. Bowel sounds are normal. There is no tenderness.  Musculoskeletal:  Generalized joint pain with point tenderness present. No visible abnormalities or deformities present at this time.   Lymphadenopathy:    She has no cervical adenopathy.  Neurological: She is alert and oriented to person, place, and time. No cranial nerve deficit.  Skin: Skin is warm and dry. Capillary refill takes less than 2 seconds. She is not diaphoretic.  Psychiatric: She has a normal mood and affect. Her behavior is normal. Judgment and thought content normal.  Nursing note and vitals reviewed.  Assessment/Plan: 1. Acute non-recurrent pansinusitis - azithromycin (ZITHROMAX) 250 MG tablet; z-pack - take as directed for 5 days  Dispense: 6 tablet; Refill: 0  2. Moderate obesity Continue phentermine daily. Limit calorie intake  to 1200-1500 calories per day. Continue with regular exercise program.  - phentermine (ADIPEX-P) 37.5 MG tablet; Take 1 tablet (37.5 mg total) by mouth daily before breakfast.  Dispense: 30 tablet; Refill: 1  General Counseling: Bannie verbalizes understanding of the findings of todays visit and agrees with plan of treatment. I have discussed any further diagnostic evaluation that may be needed or ordered today. We also reviewed her medications today. she has been encouraged to call the office with any questions or concerns that should arise related to todays visit.    Counseling:  This patient was seen by [place] with Dr Lavera Guise as a part of collaborative care agreement  Meds ordered this encounter  Medications  . azithromycin (ZITHROMAX) 250 MG tablet    Sig: z-pack - take as directed for 5 days    Dispense:  6 tablet    Refill:  0    Order Specific Question:   Supervising Provider    Answer:   Lavera Guise [place]  . phentermine (ADIPEX-P)  37.5 MG tablet    Sig: Take 1 tablet (37.5 mg total) by mouth daily before breakfast.    Dispense:  30 tablet    Refill:  1    Order Specific Question:   Supervising Provider    Answer:   Lavera Guise [[id number]]    Time spent: 22 Minutes      Dr Lavera Guise Internal medicine

## 2017-09-29 DIAGNOSIS — M722 Plantar fascial fibromatosis: Secondary | ICD-10-CM | POA: Diagnosis not present

## 2017-10-15 DIAGNOSIS — J0140 Acute pansinusitis, unspecified: Secondary | ICD-10-CM | POA: Insufficient documentation

## 2017-10-30 ENCOUNTER — Ambulatory Visit: Payer: Self-pay | Admitting: Adult Health

## 2017-11-11 ENCOUNTER — Ambulatory Visit: Payer: BLUE CROSS/BLUE SHIELD | Admitting: Nurse Practitioner

## 2017-11-11 ENCOUNTER — Encounter: Payer: Self-pay | Admitting: Nurse Practitioner

## 2017-11-11 VITALS — BP 105/62 | HR 65 | Resp 16 | Ht 63.0 in | Wt 178.2 lb

## 2017-11-11 DIAGNOSIS — E668 Other obesity: Secondary | ICD-10-CM

## 2017-11-11 DIAGNOSIS — E669 Obesity, unspecified: Secondary | ICD-10-CM

## 2017-11-11 DIAGNOSIS — M064 Inflammatory polyarthropathy: Secondary | ICD-10-CM | POA: Diagnosis not present

## 2017-11-11 MED ORDER — PHENTERMINE HCL 37.5 MG PO TABS: 37.5000 mg | ORAL_TABLET | Freq: Every day | ORAL | 1 refills | Status: DC

## 2017-11-11 NOTE — Progress Notes (Signed)
[facility] [place], [place] [address]  Internal MEDICINE  Office Visit Note  Patient Name: Jo Hampton  [id number]  [id number]  Date of Service: 11/19/2017  Chief Complaint  Patient presents with  . Medical Management of Chronic Issues    6wk follow up weight management    The patient has maintained weight over the past 6 weeks. Has not been taking phentermine for past 3 weeks. Has been biking twice a week. Is trying to monitor her diet better. Has had increased stress at work. Will be changing jobs October 1. Hoping to have less stress.       Current Medication: Outpatient Encounter Medications as of 11/11/2017  Medication Sig Note  . B Complex Vitamins (VITAMIN B COMPLEX PO) Take by mouth. 02/14/2016: Received from: [place]: Take 1 capsule by mouth daily.  . COPPER PO by Intrauterine route. 02/14/2016: Received from: [place]: 1 each by Intrauterine route once.  . diphenhydrAMINE-APAP, sleep, (NON-ASPIRIN PM EXTRA STRENGTH PO) Take by mouth.   . DULoxetine (CYMBALTA) 20 MG capsule Take 1 capsule (20 mg total) by mouth daily.   . ergocalciferol (DRISDOL) 50000 units capsule Take 1 capsule (50,000 Units total) by mouth once a week.   . Hydrocortisone Acetate 2.5 % CREA Apply 1 application topically 2 (two) times daily as needed.   . meloxicam (MOBIC) 15 MG tablet Take 1 tablet (15 mg total) by mouth daily.   . phentermine (ADIPEX-P) 37.5 MG tablet Take 1 tablet (37.5 mg total) by mouth daily before breakfast.   . [DISCONTINUED] azithromycin (ZITHROMAX) 250 MG tablet z-pack - take as directed for 5 days   . [DISCONTINUED] phentermine (ADIPEX-P) 37.5 MG tablet Take 1 tablet (37.5 mg total) by mouth daily before breakfast.    No facility-administered encounter medications on file as of 11/11/2017.     Surgical History: Past Surgical History:  Procedure Laterality Date  . ovarian  cyst removed      Medical  History: History reviewed. No pertinent past medical history.  Family History: Family History  Problem Relation Age of Onset  . Breast cancer Mother 45  . Depression Mother     Social History   Socioeconomic History  . Marital status: Single    Spouse name: Not on file  . Number of children: Not on file  . Years of education: Not on file  . Highest education level: Not on file  Occupational History  . Not on file  Social Needs  . Financial resource strain: Not on file  . Food insecurity:    Worry: Not on file    Inability: Not on file  . Transportation needs:    Medical: Not on file    Non-medical: Not on file  Tobacco Use  . Smoking status: Never Smoker  . Smokeless tobacco: Never Used  Substance and Sexual Activity  . Alcohol use: Yes    Comment: drinks socially  . Drug use: No  . Sexual activity: Not on file  Lifestyle  . Physical activity:    Days per week: Not on file    Minutes per session: Not on file  . Stress: Not on file  Relationships  . Social connections:    Talks on phone: Not on file    Gets together: Not on file    Attends religious service: Not on file    Active member of club or organization: Not on file    Attends meetings  of clubs or organizations: Not on file    Relationship status: Not on file  . Intimate partner violence:    Fear of current or ex partner: Not on file    Emotionally abused: Not on file    Physically abused: Not on file    Forced sexual activity: Not on file  Other Topics Concern  . Not on file  Social History Narrative  . Not on file      Review of Systems  Constitutional: Positive for fatigue. Negative for chills and unexpected weight change.       Maintained her weight over past 2 months.   HENT: Positive for postnasal drip, rhinorrhea, sinus pressure and sneezing. Negative for congestion, sore throat and voice change.   Eyes: Negative.  Negative for redness.  Respiratory: Negative for cough, chest tightness,  shortness of breath and wheezing.   Cardiovascular: Negative for chest pain and palpitations.  Gastrointestinal: Negative for abdominal pain, constipation, diarrhea, nausea and vomiting.  Endocrine: Negative for cold intolerance, heat intolerance, polydipsia, polyphagia and polyuria.  Genitourinary: Negative for dysuria and frequency.  Musculoskeletal: Positive for arthralgias and myalgias. Negative for back pain, joint swelling and neck pain.       Genrealized joint pain.   Skin: Negative for rash.  Allergic/Immunologic: Negative for environmental allergies.  Neurological: Negative for tremors, numbness and headaches.  Hematological: Negative for adenopathy. Does not bruise/bleed easily.  Psychiatric/Behavioral: Negative for behavioral problems (Depression), sleep disturbance and suicidal ideas. The patient is nervous/anxious.     Today's Vitals   11/11/17 1554  BP: 105/62  Pulse: 65  Resp: 16  SpO2: 98%  Weight: 178 lb 3.2 oz (80.8 kg)  Height: 5' 3"  (1.6 m)    Physical Exam  Constitutional: She is oriented to person, place, and time. She appears well-developed and well-nourished. No distress.  HENT:  Head: Normocephalic and atraumatic.  Nose: Nose normal. No rhinorrhea. Right sinus exhibits no maxillary sinus tenderness. Left sinus exhibits no maxillary sinus tenderness.  Mouth/Throat: Oropharynx is clear and moist. No oropharyngeal exudate.  Eyes: Pupils are equal, round, and reactive to light. Conjunctivae and EOM are normal.  Neck: Normal range of motion. Neck supple. No JVD present. No tracheal deviation present. No thyromegaly present.  Cardiovascular: Normal rate, regular rhythm and normal heart sounds. Exam reveals no gallop and no friction rub.  No murmur heard. Pulmonary/Chest: Effort normal and breath sounds normal. No respiratory distress. She has no wheezes. She has no rales. She exhibits no tenderness.  Abdominal: Soft. Bowel sounds are normal. There is no  tenderness.  Musculoskeletal:  Generalized joint pain with point tenderness present. No visible abnormalities or deformities present at this time.   Lymphadenopathy:    She has no cervical adenopathy.  Neurological: She is alert and oriented to person, place, and time. No cranial nerve deficit.  Skin: Skin is warm and dry. Capillary refill takes less than 2 seconds. She is not diaphoretic.  Psychiatric: She has a normal mood and affect. Her behavior is normal. Judgment and thought content normal.  Nursing note and vitals reviewed.  Assessment/Plan: 1. Inflammatory polyarthritis (HCC) Continue duloxetine as prescribed. Regular visits with orthopedics and PT as scheduled.   2. Moderate obesity Improving. May continue phentermine 37.64m tablets. Continue with low calorie diet and regular exercise program.  - phentermine (ADIPEX-P) 37.5 MG tablet; Take 1 tablet (37.5 mg total) by mouth daily before breakfast.  Dispense: 30 tablet; Refill: 1  General Counseling: Annielee verbalizes understanding of the  findings of todays visit and agrees with plan of treatment. I have discussed any further diagnostic evaluation that may be needed or ordered today. We also reviewed her medications today. she has been encouraged to call the office with any questions or concerns that should arise related to todays visit.   There is a liability release in patients' chart. There has been a 10 minute discussion about the side effects including but not limited to elevated blood pressure, anxiety, lack of sleep and dry mouth. Pt understands and will like to start/continue on appetite suppressant at this time. There will be one month RX given at the time of visit with proper follow up. Nova diet plan with restricted calories is given to the pt. Pt understands and agrees with  plan of treatment  This patient was seen by Leretha Pol FNP Collaboration with Dr Lavera Guise as a part of collaborative care  agreement    Meds ordered this encounter  Medications  . phentermine (ADIPEX-P) 37.5 MG tablet    Sig: Take 1 tablet (37.5 mg total) by mouth daily before breakfast.    Dispense:  30 tablet    Refill:  1    Order Specific Question:   Supervising Provider    Answer:   Lavera Guise [[id number]]    Time spent: 34 Minutes      Dr Lavera Guise Internal medicine

## 2017-11-19 ENCOUNTER — Other Ambulatory Visit: Payer: Self-pay | Admitting: Nurse Practitioner

## 2017-11-19 DIAGNOSIS — E668 Other obesity: Secondary | ICD-10-CM

## 2017-11-19 MED ORDER — ERGOCALCIFEROL 1.25 MG (50000 UT) PO CAPS: 50000.0000 [IU] | ORAL_CAPSULE | ORAL | 5 refills | Status: DC

## 2017-11-20 DIAGNOSIS — M1811 Unilateral primary osteoarthritis of first carpometacarpal joint, right hand: Secondary | ICD-10-CM | POA: Diagnosis not present

## 2017-11-20 DIAGNOSIS — M79641 Pain in right hand: Secondary | ICD-10-CM | POA: Diagnosis not present

## 2018-01-19 ENCOUNTER — Ambulatory Visit: Payer: Self-pay | Admitting: Nurse Practitioner

## 2018-02-12 DIAGNOSIS — G8929 Other chronic pain: Secondary | ICD-10-CM | POA: Diagnosis not present

## 2018-02-12 DIAGNOSIS — M25512 Pain in left shoulder: Secondary | ICD-10-CM | POA: Diagnosis not present

## 2018-02-12 DIAGNOSIS — M7542 Impingement syndrome of left shoulder: Secondary | ICD-10-CM | POA: Diagnosis not present

## 2018-02-12 DIAGNOSIS — M545 Low back pain: Secondary | ICD-10-CM | POA: Diagnosis not present

## 2018-02-17 ENCOUNTER — Encounter: Payer: Self-pay | Admitting: Nurse Practitioner

## 2018-02-17 ENCOUNTER — Ambulatory Visit: Payer: BLUE CROSS/BLUE SHIELD | Admitting: Nurse Practitioner

## 2018-02-17 VITALS — BP 105/68 | HR 72 | Resp 16 | Ht 63.0 in | Wt 184.0 lb

## 2018-02-17 DIAGNOSIS — R5383 Other fatigue: Secondary | ICD-10-CM | POA: Diagnosis not present

## 2018-02-17 DIAGNOSIS — E668 Other obesity: Secondary | ICD-10-CM | POA: Diagnosis not present

## 2018-02-17 DIAGNOSIS — F321 Major depressive disorder, single episode, moderate: Secondary | ICD-10-CM

## 2018-02-17 NOTE — Progress Notes (Signed)
[facility] [place], [place] [address]  Internal MEDICINE  Office Visit Note  Patient Name: Jo Hampton  [id number]  [id number]  Date of Service: 02/18/2018  Chief Complaint  Patient presents with  . Medical Management of Chronic Issues    Weight management    The patient is here for routine follow up. Had been taking appetite suppressant. Was doing well. Changed jobs and has had some life stress and has been off of this for several weeks. Has had weight gain of 6 pounds since she was last seen. Making her feel tired and does not have any enthusiasm to do things she had been enjoying.  She has also stopped taking her duloxetine. Was on 19m daily. Was helping her depression and overall joint pain. Is seeing a counselor, and was suggested she start this again to see if it helps her overall mood. Feels generally unhappy with life. Relationship and job are not fulfilling. Gets very irritable and will snap at colleagues and boyfriend frequently. Thinks that going back on duloxetine might help even out her mood and help her deal with stressors a little better.       Current Medication: Outpatient Encounter Medications as of 02/17/2018  Medication Sig Note  . DULoxetine (CYMBALTA) 20 MG capsule Take 1 capsule (20 mg total) by mouth daily.   . ergocalciferol (DRISDOL) 50000 units capsule Take 1 capsule (50,000 Units total) by mouth once a week.   . Hydrocortisone Acetate 2.5 % CREA Apply 1 application topically 2 (two) times daily as needed.   . meloxicam (MOBIC) 15 MG tablet Take 1 tablet (15 mg total) by mouth daily.   . phentermine (ADIPEX-P) 37.5 MG tablet Take 1 tablet (37.5 mg total) by mouth daily before breakfast.   . [DISCONTINUED] B Complex Vitamins (VITAMIN B COMPLEX PO) Take by mouth. 02/14/2016: [facility]: [facility] Take 1 capsule by mouth daily.  . [DISCONTINUED] COPPER PO by Intrauterine route. 02/14/2016: Received from: [facility] 1 each by Intrauterine route once.  . [DISCONTINUED] diphenhydrAMINE-APAP, sleep, (NON-ASPIRIN PM EXTRA STRENGTH PO) Take by mouth.    No facility-administered encounter medications on file as of 02/17/2018.     Surgical History: Past Surgical History:  Procedure Laterality Date  . ovarian  cyst removed      Medical History: History reviewed. No pertinent past medical history.  Family History: Family History  Problem Relation Age of Onset  . Breast cancer Mother 416 . Depression Mother     Social History   Socioeconomic History  . Marital status: Single    Spouse name: Not on file  . Number of children: Not on file  . Years of education: Not on file  . Highest education level: Not on file  Occupational History  . Not on file  Social Needs  . Financial resource strain: Not on file  . Food insecurity:    Worry: Not on file    Inability: Not on file  . Transportation needs:    Medical: Not on file    Non-medical: Not on file  Tobacco Use  . Smoking status: Never Smoker  . Smokeless tobacco: Never Used  Substance and Sexual Activity  . Alcohol use: Yes    Comment: drinks socially  . Drug use: No  . Sexual activity: Not on file  Lifestyle  . Physical activity:    Days per week: Not on file    Minutes per session:  Not on file  . Stress: Not on file  Relationships  . Social connections:    Talks on phone: Not on file    Gets together: Not on file    Attends religious service: Not on file    Active member of club or organization: Not on file    Attends meetings of clubs or organizations: Not on file    Relationship status: Not on file  . Intimate partner violence:    Fear of current or ex partner: Not on file    Emotionally abused: Not on file    Physically abused: Not on file    Forced sexual activity: Not on file  Other Topics Concern  . Not on file  Social History Narrative  . Not on file      Review of Systems   Constitutional: Positive for fatigue. Negative for chills and unexpected weight change.       Weight gain of 6 pounds since her last visit.   HENT: Negative for congestion, postnasal drip, rhinorrhea, sinus pressure, sneezing, sore throat and voice change.   Respiratory: Negative for cough, chest tightness, shortness of breath and wheezing.   Cardiovascular: Negative for chest pain and palpitations.  Gastrointestinal: Negative for abdominal pain, constipation, diarrhea, nausea and vomiting.  Endocrine: Negative for cold intolerance, heat intolerance, polydipsia, polyphagia and polyuria.  Musculoskeletal: Positive for arthralgias and myalgias. Negative for back pain, joint swelling and neck pain.       Genrealized joint pain.   Skin: Negative for rash.  Allergic/Immunologic: Negative for environmental allergies.  Neurological: Negative for tremors, numbness and headaches.  Hematological: Negative for adenopathy. Does not bruise/bleed easily.  Psychiatric/Behavioral: Positive for dysphoric mood. Negative for behavioral problems (Depression), sleep disturbance and suicidal ideas. The patient is nervous/anxious.     Today's Vitals   02/17/18 1538  BP: 105/68  Pulse: 72  Resp: 16  SpO2: 97%  Weight: 184 lb (83.5 kg)  Height: 5' 3"  (1.6 m)    Physical Exam  Constitutional: She is oriented to person, place, and time. She appears well-developed and well-nourished. No distress.  HENT:  Head: Normocephalic and atraumatic.  Nose: Nose normal. No rhinorrhea. Right sinus exhibits no maxillary sinus tenderness. Left sinus exhibits no maxillary sinus tenderness.  Mouth/Throat: No oropharyngeal exudate.  Eyes: Pupils are equal, round, and reactive to light. Conjunctivae and EOM are normal.  Neck: Normal range of motion. Neck supple. No JVD present. No tracheal deviation present. No thyromegaly present.  Cardiovascular: Normal rate, regular rhythm and normal heart sounds. Exam reveals no gallop  and no friction rub.  No murmur heard. Pulmonary/Chest: Effort normal and breath sounds normal. No respiratory distress. She has no wheezes. She has no rales. She exhibits no tenderness.  Abdominal: Soft. Bowel sounds are normal. There is no tenderness.  Musculoskeletal:  Generalized joint pain with point tenderness present. No visible abnormalities or deformities present at this time.   Lymphadenopathy:    She has no cervical adenopathy.  Neurological: She is alert and oriented to person, place, and time. No cranial nerve deficit.  Skin: Skin is warm and dry. Capillary refill takes less than 2 seconds. She is not diaphoretic.  Psychiatric: Her speech is normal and behavior is normal. Judgment and thought content normal. Her mood appears anxious. Cognition and memory are normal. She exhibits a depressed mood.  Nursing note and vitals reviewed.  Assessment/Plan: 1. Other fatigue Likely related to increased levels of stress.   2. Moderate obesity Ok to  restart phentermine daily. Limit calorie intake to 1200-150 calories per day and incorporate exercise into daily routine.   3. Episode of moderate major depression (HCC) Restart duloxetine 82m daily. Reassess at next visit.   General Counseling: Sonni verbalizes understanding of the findings of todays visit and agrees with plan of treatment. I have discussed any further diagnostic evaluation that may be needed or ordered today. We also reviewed her medications today. she has been encouraged to call the office with any questions or concerns that should arise related to todays visit.   There is a liability release in patients' chart. There has been a 10 minute discussion about the side effects including but not limited to elevated blood pressure, anxiety, lack of sleep and dry mouth. Pt understands and will like to start/continue on appetite suppressant at this time. There will be one month RX given at the time of visit with proper follow  up. Nova diet plan with restricted calories is given to the pt. Pt understands and agrees with  plan of treatment  This patient was seen by HLeretha PolFNP Collaboration with Dr FLavera Guiseas a part of collaborative care agreement  Time spent: 25 Minutes      Dr FLavera GuiseInternal medicine

## 2018-02-18 DIAGNOSIS — R5383 Other fatigue: Secondary | ICD-10-CM | POA: Insufficient documentation

## 2018-02-18 DIAGNOSIS — F321 Major depressive disorder, single episode, moderate: Secondary | ICD-10-CM | POA: Insufficient documentation

## 2018-02-24 DIAGNOSIS — M545 Low back pain: Secondary | ICD-10-CM | POA: Diagnosis not present

## 2018-02-24 DIAGNOSIS — M25512 Pain in left shoulder: Secondary | ICD-10-CM | POA: Diagnosis not present

## 2018-02-24 DIAGNOSIS — G8929 Other chronic pain: Secondary | ICD-10-CM | POA: Diagnosis not present

## 2018-03-19 ENCOUNTER — Ambulatory Visit: Payer: Self-pay | Admitting: Nurse Practitioner

## 2018-04-08 ENCOUNTER — Encounter: Payer: Self-pay | Admitting: Adult Health

## 2018-04-08 ENCOUNTER — Ambulatory Visit: Payer: BLUE CROSS/BLUE SHIELD | Admitting: Adult Health

## 2018-04-08 VITALS — BP 120/62 | HR 66 | Temp 96.0°F | Resp 16 | Ht 63.0 in | Wt 189.0 lb

## 2018-04-08 DIAGNOSIS — J302 Other seasonal allergic rhinitis: Secondary | ICD-10-CM | POA: Diagnosis not present

## 2018-04-08 DIAGNOSIS — J029 Acute pharyngitis, unspecified: Secondary | ICD-10-CM

## 2018-04-08 DIAGNOSIS — R0989 Other specified symptoms and signs involving the circulatory and respiratory systems: Secondary | ICD-10-CM | POA: Diagnosis not present

## 2018-04-08 LAB — POCT RAPID STREP A (OFFICE): RAPID STREP A SCREEN: NEGATIVE

## 2018-04-08 MED ORDER — FLUTICASONE PROPIONATE 50 MCG/ACT NA SUSP: 2.0000 | Freq: Every day | NASAL | 6 refills | Status: DC

## 2018-04-08 MED ORDER — GUAIFENESIN 200 MG PO TABS: 200.0000 mg | ORAL_TABLET | Freq: Four times a day (QID) | ORAL | 0 refills | Status: DC | PRN

## 2018-04-08 NOTE — Patient Instructions (Signed)
Pharyngitis  Pharyngitis is a sore throat (pharynx). This is when there is redness, pain, and swelling in your throat. Most of the time, this condition gets better on its own. In some cases, you may need medicine. Follow these instructions at home:  Take over-the-counter and prescription medicines only as told by your doctor. ? If you were prescribed an antibiotic medicine, take it as told by your doctor. Do not stop taking the antibiotic even if you start to feel better. ? Do not give children aspirin. Aspirin has been linked to Reye syndrome.  Drink enough water and fluids to keep your pee (urine) clear or pale yellow.  Get a lot of rest.  Rinse your mouth (gargle) with a salt-water mixture 3-4 times a day or as needed. To make a salt-water mixture, completely dissolve -1 tsp of salt in 1 cup of warm water.  If your doctor approves, you may use throat lozenges or sprays to soothe your throat. Contact a doctor if:  You have large, tender lumps in your neck.  You have a rash.  You cough up green, yellow-brown, or bloody spit. Get help right away if:  You have a stiff neck.  You drool or cannot swallow liquids.  You cannot drink or take medicines without throwing up.  You have very bad pain that does not go away with medicine.  You have problems breathing, and it is not from a stuffy nose.  You have new pain and swelling in your knees, ankles, wrists, or elbows. Summary  Pharyngitis is a sore throat (pharynx). This is when there is redness, pain, and swelling in your throat.  If you were prescribed an antibiotic medicine, take it as told by your doctor. Do not stop taking the antibiotic even if you start to feel better.  Most of the time, pharyngitis gets better on its own. Sometimes, you may need medicine. This information is not intended to replace advice given to you by your health care provider. Make sure you discuss any questions you have with your health care  provider. Document Released: 08/14/2007 Document Revised: 04/02/2016 Document Reviewed: 04/02/2016 Elsevier Interactive Patient Education  2019 [employer].

## 2018-04-08 NOTE — Progress Notes (Signed)
[facility] [place], [place] [address]  Internal MEDICINE  Office Visit Note  Patient Name: Jo Hampton  [id number]  [id number]  Date of Service: 04/26/2018  Chief Complaint  Patient presents with  . Sore Throat     HPI Pt is here for a sick visit.  She reports 2 days of a sore throat and cough.  She denies any fever or chills.  She dates that when she lays down it feels like she is choking.  She does have some mild pressure in her sinuses however she said it gets better throughout the day.   Current Medication:  Outpatient Encounter Medications as of 04/08/2018  Medication Sig  . DULoxetine (CYMBALTA) 20 MG capsule Take 1 capsule (20 mg total) by mouth daily.  . ergocalciferol (DRISDOL) 50000 units capsule Take 1 capsule (50,000 Units total) by mouth once a week.  . Hydrocortisone Acetate 2.5 % CREA Apply 1 application topically 2 (two) times daily as needed.  . meloxicam (MOBIC) 15 MG tablet Take 1 tablet (15 mg total) by mouth daily.  . phentermine (ADIPEX-P) 37.5 MG tablet Take 1 tablet (37.5 mg total) by mouth daily before breakfast.  . fluticasone (FLONASE) 50 MCG/ACT nasal spray Place 2 sprays into both nostrils daily.  Marland Kitchen guaiFENesin 200 MG tablet Take 1 tablet (200 mg total) by mouth every 6 (six) hours as needed for cough or to loosen phlegm.   No facility-administered encounter medications on file as of 04/08/2018.       Medical History: History reviewed. No pertinent past medical history.   Vital Signs: BP 120/62   Pulse 66   Temp (!) 96 F (35.6 C)   Resp 16   Ht 5' 3"  (1.6 m)   Wt 189 lb (85.7 kg)   SpO2 97%   BMI 33.48 kg/m    Review of Systems  Constitutional: Negative for chills, fatigue and unexpected weight change.  HENT: Positive for sore throat. Negative for congestion, rhinorrhea and sneezing.   Eyes: Negative for photophobia, pain and redness.  Respiratory: Negative for cough, chest tightness and shortness of  breath.   Cardiovascular: Negative for chest pain and palpitations.  Gastrointestinal: Negative for abdominal pain, constipation, diarrhea, nausea and vomiting.  Endocrine: Negative.   Genitourinary: Negative for dysuria and frequency.  Musculoskeletal: Negative for arthralgias, back pain, joint swelling and neck pain.  Skin: Negative for rash.  Allergic/Immunologic: Negative.   Neurological: Negative for tremors and numbness.  Hematological: Negative for adenopathy. Does not bruise/bleed easily.  Psychiatric/Behavioral: Negative for behavioral problems and sleep disturbance. The patient is not nervous/anxious.     Physical Exam Vitals signs and nursing note reviewed.  Constitutional:      General: She is not in acute distress.    Appearance: She is well-developed. She is not diaphoretic.  HENT:     Head: Normocephalic and atraumatic.     Mouth/Throat:     Pharynx: No oropharyngeal exudate.  Eyes:     Pupils: Pupils are equal, round, and reactive to light.  Neck:     Musculoskeletal: Normal range of motion and neck supple.     Thyroid: No thyromegaly.     Vascular: No JVD.     Trachea: No tracheal deviation.  Cardiovascular:     Rate and Rhythm: Normal rate and regular rhythm.     Heart sounds: Normal heart sounds. No murmur. No friction rub. No gallop.   Pulmonary:     Effort: Pulmonary effort is normal.  No respiratory distress.     Breath sounds: Normal breath sounds. No wheezing or rales.  Chest:     Chest wall: No tenderness.  Abdominal:     Palpations: Abdomen is soft.     Tenderness: There is no abdominal tenderness. There is no guarding.  Musculoskeletal: Normal range of motion.  Lymphadenopathy:     Cervical: No cervical adenopathy.  Skin:    General: Skin is warm and dry.  Neurological:     Mental Status: She is alert and oriented to person, place, and time.     Cranial Nerves: No cranial nerve deficit.  Psychiatric:        Behavior: Behavior normal.         Thought Content: Thought content normal.        Judgment: Judgment normal.    Assessment/Plan: 1. Seasonal allergic rhinitis, unspecified trigger Instructed to use Flonase for at least 7 days 2 sprays in each nostril.  Also advised her to get OTC allergy medication such as Claritin or Zyrtec.  Instructed her to return to clinic if she develops a fever or any new symptoms of infection. - fluticasone (FLONASE) 50 MCG/ACT nasal spray; Place 2 sprays into both nostrils daily.  Dispense: 16 g; Refill: 6  2. Sore throat Rapid strep negative - POCT rapid strep A  3. Chest congestion Patient provided with guaifenesin tablets for cough. - guaiFENesin 200 MG tablet; Take 1 tablet (200 mg total) by mouth every 6 (six) hours as needed for cough or to loosen phlegm.  Dispense: 30 tablet; Refill: 0  General Counseling: Sara verbalizes understanding of the findings of todays visit and agrees with plan of treatment. I have discussed any further diagnostic evaluation that may be needed or ordered today. We also reviewed her medications today. she has been encouraged to call the office with any questions or concerns that should arise related to todays visit.   Orders Placed This Encounter  Procedures  . POCT rapid strep A    Meds ordered this encounter  Medications  . fluticasone (FLONASE) 50 MCG/ACT nasal spray    Sig: Place 2 sprays into both nostrils daily.    Dispense:  16 g    Refill:  6  . guaiFENesin 200 MG tablet    Sig: Take 1 tablet (200 mg total) by mouth every 6 (six) hours as needed for cough or to loosen phlegm.    Dispense:  30 tablet    Refill:  0    Time spent: 25 Minutes  This patient was seen by Orson Gear AGNP-C in Collaboration with Dr Lavera Guise as a part of collaborative care agreement.  Kendell Bane AGNP-C Internal Medicine

## 2018-05-15 ENCOUNTER — Other Ambulatory Visit: Payer: Self-pay

## 2018-05-26 ENCOUNTER — Ambulatory Visit: Payer: Self-pay | Admitting: Nurse Practitioner

## 2018-06-25 ENCOUNTER — Other Ambulatory Visit: Payer: Self-pay | Admitting: Internal Medicine

## 2018-06-25 DIAGNOSIS — Z1231 Encounter for screening mammogram for malignant neoplasm of breast: Secondary | ICD-10-CM

## 2018-07-07 ENCOUNTER — Other Ambulatory Visit: Payer: Self-pay

## 2018-07-07 ENCOUNTER — Encounter: Payer: Self-pay | Admitting: Nurse Practitioner

## 2018-07-07 ENCOUNTER — Ambulatory Visit: Payer: BLUE CROSS/BLUE SHIELD | Admitting: Nurse Practitioner

## 2018-07-07 DIAGNOSIS — R5383 Other fatigue: Secondary | ICD-10-CM | POA: Diagnosis not present

## 2018-07-07 DIAGNOSIS — F411 Generalized anxiety disorder: Secondary | ICD-10-CM | POA: Diagnosis not present

## 2018-07-07 DIAGNOSIS — F321 Major depressive disorder, single episode, moderate: Secondary | ICD-10-CM | POA: Diagnosis not present

## 2018-07-07 MED ORDER — DULOXETINE HCL 20 MG PO CPEP: 20.0000 mg | ORAL_CAPSULE | Freq: Every day | ORAL | 2 refills | Status: DC

## 2018-07-07 MED ORDER — CLONAZEPAM 0.5 MG PO TABS: 0.5000 mg | ORAL_TABLET | Freq: Two times a day (BID) | ORAL | 2 refills | Status: DC | PRN

## 2018-07-07 NOTE — Progress Notes (Signed)
[facility] [place], [place] [address]  Internal MEDICINE  Telephone Visit  Patient Name: Jo Hampton  [id number]  [id number]  Date of Service: 07/26/2018  I connected with the patient at 2:38pm by webcam and verified the patients identity using two identifiers.   I discussed the limitations, risks, security and privacy concerns of performing an evaluation and management service by webcam and the availability of in person appointments. I also discussed with the patient that there may be a patient responsible charge related to the service.  The patient expressed understanding and agrees to proceed.    Chief Complaint  Patient presents with  . Telephone Screen    VIDEO VISIT [phone]  . Telephone Assessment  . Anxiety    has been going on for the past couple of weeks, more so like a nervous break down    The patient has been contacted via webcam for follow up visit due to concerns for spread of novel coronavirus. The patient is having some personal stress. Separated from her partner of six years. She feels lonesome at times. Working from home, but spending a lot of time alone at home. She is seeing a Social worker for this. She does have episodes of significant depression. Cries often and sometimes for unknown reasons. She will have to leave the company of her friends and family so she is not around them when she is crying.       Current Medication: Outpatient Encounter Medications as of 07/07/2018  Medication Sig  . fluticasone (FLONASE) 50 MCG/ACT nasal spray Place 2 sprays into both nostrils daily.  . Hydrocortisone Acetate 2.5 % CREA Apply 1 application topically 2 (two) times daily as needed.  . phentermine (ADIPEX-P) 37.5 MG tablet Take 1 tablet (37.5 mg total) by mouth daily before breakfast.  . clonazePAM (KLONOPIN) 0.5 MG tablet Take 1 tablet (0.5 mg total) by mouth 2 (two) times daily as needed for anxiety.  . DULoxetine (CYMBALTA) 20 MG capsule Take  1 capsule (20 mg total) by mouth daily.  . ergocalciferol (DRISDOL) 50000 units capsule Take 1 capsule (50,000 Units total) by mouth once a week. (Patient not taking: Reported on 07/07/2018)  . guaiFENesin 200 MG tablet Take 1 tablet (200 mg total) by mouth every 6 (six) hours as needed for cough or to loosen phlegm.  . meloxicam (MOBIC) 15 MG tablet Take 1 tablet (15 mg total) by mouth daily. (Patient not taking: Reported on 07/07/2018)  . [DISCONTINUED] DULoxetine (CYMBALTA) 20 MG capsule Take 1 capsule (20 mg total) by mouth daily. (Patient not taking: Reported on 07/07/2018)   No facility-administered encounter medications on file as of 07/07/2018.     Surgical History: Past Surgical History:  Procedure Laterality Date  . ovarian  cyst removed      Medical History: History reviewed. No pertinent past medical history.  Family History: Family History  Problem Relation Age of Onset  . Breast cancer Mother 49  . Depression Mother     Social History   Socioeconomic History  . Marital status: Single    Spouse name: Not on file  . Number of children: Not on file  . Years of education: Not on file  . Highest education level: Not on file  Occupational History  . Not on file  Social Needs  . Financial resource strain: Not on file  . Food insecurity:    Worry: Not on file    Inability: Not on file  . Transportation needs:  Medical: Not on file    Non-medical: Not on file  Tobacco Use  . Smoking status: Never Smoker  . Smokeless tobacco: Never Used  Substance and Sexual Activity  . Alcohol use: Yes    Comment: drinks socially  . Drug use: No  . Sexual activity: Not on file  Lifestyle  . Physical activity:    Days per week: Not on file    Minutes per session: Not on file  . Stress: Not on file  Relationships  . Social connections:    Talks on phone: Not on file    Gets together: Not on file    Attends religious service: Not on file    Active member of club or  organization: Not on file    Attends meetings of clubs or organizations: Not on file    Relationship status: Not on file  . Intimate partner violence:    Fear of current or ex partner: Not on file    Emotionally abused: Not on file    Physically abused: Not on file    Forced sexual activity: Not on file  Other Topics Concern  . Not on file  Social History Narrative  . Not on file      Review of Systems  Constitutional: Positive for fatigue. Negative for chills and unexpected weight change.  HENT: Negative for congestion, postnasal drip, rhinorrhea, sinus pressure, sneezing, sore throat and voice change.   Respiratory: Negative for cough, chest tightness, shortness of breath and wheezing.   Cardiovascular: Negative for chest pain and palpitations.  Gastrointestinal: Negative for abdominal pain, constipation, diarrhea, nausea and vomiting.  Endocrine: Negative for cold intolerance, heat intolerance, polydipsia and polyuria.  Musculoskeletal: Positive for arthralgias and myalgias. Negative for back pain, joint swelling and neck pain.       Genrealized joint pain.   Skin: Negative for rash.  Allergic/Immunologic: Negative for environmental allergies.  Neurological: Positive for headaches. Negative for tremors and numbness.  Hematological: Negative for adenopathy. Does not bruise/bleed easily.  Psychiatric/Behavioral: Positive for dysphoric mood. Negative for behavioral problems (Depression), sleep disturbance and suicidal ideas. The patient is nervous/anxious.        Worsening anxiety and depression.     Vital Signs: There were no vitals taken for this visit.   Observation/Objective:   The patient is alert and oriented. She is pleasant and answers all questions appropriately. Breathing is non-labored. She is in no acute distress at this time. She is visibly upset and seems anxious. She is tearful off and on throughout the visit.    Assessment/Plan: 1. Other fatigue Likely  related to increased anxiety and depression. Will continue to monitor.   2. Generalized anxiety disorder Add clonazepam 0.5m tablets. May take twice daily when needed. A new prescription was sent to her pharmacy. - clonazePAM (KLONOPIN) 0.5 MG tablet; Take 1 tablet (0.5 mg total) by mouth 2 (two) times daily as needed for anxiety.  Dispense: 30 tablet; Refill: 2  3. Episode of moderate major depression (HCC) Restart duloxetine 238mdaily. New prescription sent to her pharmacy today.  - DULoxetine (CYMBALTA) 20 MG capsule; Take 1 capsule (20 mg total) by mouth daily.  Dispense: 30 capsule; Refill: 2  General Counseling: Reesa verbalizes understanding of the findings of today's phone visit and agrees with plan of treatment. I have discussed any further diagnostic evaluation that may be needed or ordered today. We also reviewed her medications today. she has been encouraged to call the office with any questions or  concerns that should arise related to todays visit.    This patient was seen by [place] with Dr Lavera Guise as a part of collaborative care agreement  Meds ordered this encounter  Medications  . DULoxetine (CYMBALTA) 20 MG capsule    Sig: Take 1 capsule (20 mg total) by mouth daily.    Dispense:  30 capsule    Refill:  2    Order Specific Question:   Supervising Provider    Answer:   Lavera Guise [[id number]]  . clonazePAM (KLONOPIN) 0.5 MG tablet    Sig: Take 1 tablet (0.5 mg total) by mouth 2 (two) times daily as needed for anxiety.    Dispense:  30 tablet    Refill:  2    Order Specific Question:   Supervising Provider    Answer:   Lavera Guise [[id number]]    Time spent: 75 Minutes    Dr Lavera Guise Internal medicine

## 2018-07-26 DIAGNOSIS — F411 Generalized anxiety disorder: Secondary | ICD-10-CM | POA: Insufficient documentation

## 2018-07-30 ENCOUNTER — Other Ambulatory Visit: Payer: Self-pay | Admitting: Nurse Practitioner

## 2018-07-30 DIAGNOSIS — F321 Major depressive disorder, single episode, moderate: Secondary | ICD-10-CM

## 2018-08-17 DIAGNOSIS — G5601 Carpal tunnel syndrome, right upper limb: Secondary | ICD-10-CM | POA: Diagnosis not present

## 2018-08-17 DIAGNOSIS — M654 Radial styloid tenosynovitis [de Quervain]: Secondary | ICD-10-CM | POA: Diagnosis not present

## 2018-08-20 DIAGNOSIS — Z6833 Body mass index (BMI) 33.0-33.9, adult: Secondary | ICD-10-CM | POA: Diagnosis not present

## 2018-08-20 DIAGNOSIS — Z01419 Encounter for gynecological examination (general) (routine) without abnormal findings: Secondary | ICD-10-CM | POA: Diagnosis not present

## 2018-08-24 ENCOUNTER — Encounter: Payer: Self-pay | Admitting: Nurse Practitioner

## 2018-08-24 ENCOUNTER — Ambulatory Visit: Payer: BLUE CROSS/BLUE SHIELD | Admitting: Nurse Practitioner

## 2018-08-24 ENCOUNTER — Other Ambulatory Visit: Payer: Self-pay

## 2018-08-24 VITALS — BP 120/80 | HR 73 | Resp 16 | Ht 63.0 in | Wt 185.0 lb

## 2018-08-24 DIAGNOSIS — Z79899 Other long term (current) drug therapy: Secondary | ICD-10-CM | POA: Diagnosis not present

## 2018-08-24 DIAGNOSIS — F411 Generalized anxiety disorder: Secondary | ICD-10-CM | POA: Diagnosis not present

## 2018-08-24 DIAGNOSIS — J069 Acute upper respiratory infection, unspecified: Secondary | ICD-10-CM | POA: Insufficient documentation

## 2018-08-24 LAB — POCT URINE DRUG SCREEN
POC Amphetamine UR: NOT DETECTED
POC BENZODIAZEPINES UR: NOT DETECTED
POC Barbiturate UR: NOT DETECTED
POC Cocaine UR: NOT DETECTED
POC Ecstasy UR: NOT DETECTED
POC Marijuana UR: NOT DETECTED
POC Methadone UR: NOT DETECTED
POC Methamphetamine UR: NOT DETECTED
POC Opiate Ur: NOT DETECTED
POC Oxycodone UR: NOT DETECTED
POC PHENCYCLIDINE UR: NOT DETECTED
POC TRICYCLICS UR: NOT DETECTED

## 2018-08-24 MED ORDER — AZITHROMYCIN 250 MG PO TABS: ORAL_TABLET | ORAL | 0 refills | Status: DC

## 2018-08-24 NOTE — Progress Notes (Signed)
[facility] [place], [place] [address]  Internal MEDICINE  Office Visit Note  Patient Name: Jo Hampton  [id number]  [id number]  Date of Service: 08/24/2018  Chief Complaint  Patient presents with  . Medical Management of Chronic Issues    weight managment    The patient is here for routine follow up. She was started on duloxetine 74m daily. She sates that she is doing much better in relation to depression and anxiety. States that it's amazing how much better she feels since she started on this medication. She states that she has taken the clonazepam at night as needed to help with anxiety and difficulty sleeping. She is taking it most nights except if she consumes alcohol. She will not mix the two together. She does state that she has had a scratchy throat with nasal congestion. Has been going on for three days. Unchanged since symptoms began. She has not taken any medication since symptoms started.       Current Medication: Outpatient Encounter Medications as of 08/24/2018  Medication Sig  . clonazePAM (KLONOPIN) 0.5 MG tablet Take 1 tablet (0.5 mg total) by mouth 2 (two) times daily as needed for anxiety.  . DULoxetine (CYMBALTA) 20 MG capsule TAKE 1 CAPSULE BY MOUTH EVERY DAY  . ergocalciferol (DRISDOL) 50000 units capsule Take 1 capsule (50,000 Units total) by mouth once a week.  . fluticasone (FLONASE) 50 MCG/ACT nasal spray Place 2 sprays into both nostrils daily.  .Marland KitchenguaiFENesin 200 MG tablet Take 1 tablet (200 mg total) by mouth every 6 (six) hours as needed for cough or to loosen phlegm.  . Hydrocortisone Acetate 2.5 % CREA Apply 1 application topically 2 (two) times daily as needed.  . meloxicam (MOBIC) 15 MG tablet Take 1 tablet (15 mg total) by mouth daily.  . phentermine (ADIPEX-P) 37.5 MG tablet Take 1 tablet (37.5 mg total) by mouth daily before breakfast.  . azithromycin (ZITHROMAX) 250 MG tablet z-pack - take as directed for 5 days for upper  respiratory infection.   No facility-administered encounter medications on file as of 08/24/2018.     Surgical History: Past Surgical History:  Procedure Laterality Date  . ovarian  cyst removed      Medical History: History reviewed. No pertinent past medical history.  Family History: Family History  Problem Relation Age of Onset  . Breast cancer Mother 475 . Depression Mother     Social History   Socioeconomic History  . Marital status: Single    Spouse name: Not on file  . Number of children: Not on file  . Years of education: Not on file  . Highest education level: Not on file  Occupational History  . Not on file  Social Needs  . Financial resource strain: Not on file  . Food insecurity    Worry: Not on file    Inability: Not on file  . Transportation needs    Medical: Not on file    Non-medical: Not on file  Tobacco Use  . Smoking status: Never Smoker  . Smokeless tobacco: Never Used  Substance and Sexual Activity  . Alcohol use: Yes    Comment: drinks socially  . Drug use: No  . Sexual activity: Not on file  Lifestyle  . Physical activity    Days per week: Not on file    Minutes per session: Not on file  . Stress: Not on file  Relationships  . Social connections  Talks on phone: Not on file    Gets together: Not on file    Attends religious service: Not on file    Active member of club or organization: Not on file    Attends meetings of clubs or organizations: Not on file    Relationship status: Not on file  . Intimate partner violence    Fear of current or ex partner: Not on file    Emotionally abused: Not on file    Physically abused: Not on file    Forced sexual activity: Not on file  Other Topics Concern  . Not on file  Social History Narrative  . Not on file      Review of Systems  Constitutional: Positive for fatigue. Negative for chills and unexpected weight change.  HENT: Negative for congestion, postnasal drip, rhinorrhea,  sinus pressure, sneezing, sore throat and voice change.   Respiratory: Negative for cough, chest tightness, shortness of breath and wheezing.   Cardiovascular: Negative for chest pain and palpitations.  Gastrointestinal: Negative for abdominal pain, constipation, diarrhea, nausea and vomiting.  Endocrine: Negative for cold intolerance, heat intolerance, polydipsia and polyuria.  Musculoskeletal: Positive for arthralgias and myalgias. Negative for back pain, joint swelling and neck pain.       Genrealized joint pain.   Skin: Negative for rash.  Allergic/Immunologic: Negative for environmental allergies.  Neurological: Positive for headaches. Negative for tremors and numbness.  Hematological: Negative for adenopathy. Does not bruise/bleed easily.  Psychiatric/Behavioral: Positive for dysphoric mood. Negative for behavioral problems (Depression), sleep disturbance and suicidal ideas. The patient is nervous/anxious.        Improved since starting on duloxetine 49m daily. Does take clonazepam 0.573mwhen needed for acute insomnia.     Today's Vitals   08/24/18 1352  BP: 120/80  Pulse: 73  Resp: 16  SpO2: 97%  Weight: 185 lb (83.9 kg)  Height: 5' 3"  (1.6 m)   Body mass index is 32.77 kg/m.  Physical Exam Vitals signs and nursing note reviewed.  Constitutional:      General: She is not in acute distress.    Appearance: Normal appearance. She is well-developed. She is not diaphoretic.  HENT:     Head: Normocephalic and atraumatic.     Nose: Congestion present.     Mouth/Throat:     Pharynx: Posterior oropharyngeal erythema present. No oropharyngeal exudate.  Eyes:     Pupils: Pupils are equal, round, and reactive to light.  Neck:     Musculoskeletal: Normal range of motion and neck supple.     Thyroid: No thyromegaly.     Vascular: No JVD.     Trachea: No tracheal deviation.  Cardiovascular:     Rate and Rhythm: Normal rate and regular rhythm.     Heart sounds: Normal heart  sounds. No murmur. No friction rub. No gallop.   Pulmonary:     Effort: Pulmonary effort is normal. No respiratory distress.     Breath sounds: Normal breath sounds. No wheezing or rales.  Chest:     Chest wall: No tenderness.  Abdominal:     Palpations: Abdomen is soft.     Tenderness: There is no abdominal tenderness. There is no guarding.  Musculoskeletal: Normal range of motion.  Lymphadenopathy:     Cervical: Cervical adenopathy present.  Skin:    General: Skin is warm and dry.  Neurological:     Mental Status: She is alert and oriented to person, place, and time.  Cranial Nerves: No cranial nerve deficit.  Psychiatric:        Behavior: Behavior normal.        Thought Content: Thought content normal.        Judgment: Judgment normal.    Assessment/Plan: 1. Acute upper respiratory infection Start z-pack - take as directed for 5 days. Rest and increased fluids. Take OTC medication as needed to improve symptoms.  - azithromycin (ZITHROMAX) 250 MG tablet; z-pack - take as directed for 5 days for upper respiratory infection.  Dispense: 6 tablet; Refill: 0  2. Generalized anxiety disorder Improved duloxetine 57m daily. May take clonazepam 0.585mas needed and as prescribed   3. Encounter for long-term (current) use of high-risk medication - POCT Urine Drug Screen negative for all controlled medications. This is appropriate as she takes this only as needed.   General Counseling: Rhapsody verbalizes understanding of the findings of todays visit and agrees with plan of treatment. I have discussed any further diagnostic evaluation that may be needed or ordered today. We also reviewed her medications today. she has been encouraged to call the office with any questions or concerns that should arise related to todays visit.  Refilled Controlled medications today. Reviewed risks and possible side effects associated with taking Stimulants. Combination of these drugs with other psychotropic  medications could cause dizziness and drowsiness. Pt needs to Monitor symptoms and exercise caution in driving and operating heavy machinery to avoid damages to oneself, to others and to the surroundings. Patient verbalized understanding in this matter. Dependence and abuse for these drugs will be monitored closely. A Controlled substance policy and procedure is on file which allows [facility] to order a urine drug screen test at any visit. Patient understands and agrees with the plan..  This patient was seen by HeLeretha PolNP Collaboration with Dr FoLavera Guises a part of collaborative care agreement  Orders Placed This Encounter  Procedures  . POCT Urine Drug Screen    Meds ordered this encounter  Medications  . azithromycin (ZITHROMAX) 250 MG tablet    Sig: z-pack - take as directed for 5 days for upper respiratory infection.    Dispense:  6 tablet    Refill:  0    Order Specific Question:   Supervising Provider    Answer:   KHLavera Guise1[[id number]]  Time spent: 2524inutes      Dr FoLavera Guisenternal medicine

## 2018-09-04 ENCOUNTER — Ambulatory Visit
Admission: RE | Admit: 2018-09-04 | Discharge: 2018-09-04 | Disposition: A | Discharge status: Home / Self Care | Payer: BLUE CROSS/BLUE SHIELD | Source: Ambulatory Visit | Attending: Internal Medicine | Admitting: Internal Medicine

## 2018-09-04 ENCOUNTER — Other Ambulatory Visit: Payer: Self-pay

## 2018-09-04 DIAGNOSIS — Z1231 Encounter for screening mammogram for malignant neoplasm of breast: Secondary | ICD-10-CM | POA: Insufficient documentation

## 2018-09-10 DIAGNOSIS — G5601 Carpal tunnel syndrome, right upper limb: Secondary | ICD-10-CM | POA: Diagnosis not present

## 2018-10-01 DIAGNOSIS — H524 Presbyopia: Secondary | ICD-10-CM | POA: Diagnosis not present

## 2018-11-26 ENCOUNTER — Ambulatory Visit: Payer: BLUE CROSS/BLUE SHIELD | Admitting: Nurse Practitioner

## 2018-12-17 ENCOUNTER — Encounter: Payer: Self-pay | Admitting: Nurse Practitioner

## 2018-12-17 ENCOUNTER — Ambulatory Visit: Payer: BLUE CROSS/BLUE SHIELD | Admitting: Nurse Practitioner

## 2018-12-17 ENCOUNTER — Other Ambulatory Visit: Payer: Self-pay

## 2018-12-17 VITALS — BP 115/79 | HR 67 | Temp 98.0°F | Resp 16 | Ht 63.0 in | Wt 186.0 lb

## 2018-12-17 DIAGNOSIS — F411 Generalized anxiety disorder: Secondary | ICD-10-CM

## 2018-12-17 DIAGNOSIS — F321 Major depressive disorder, single episode, moderate: Secondary | ICD-10-CM | POA: Diagnosis not present

## 2018-12-17 DIAGNOSIS — M064 Inflammatory polyarthropathy: Secondary | ICD-10-CM | POA: Diagnosis not present

## 2018-12-17 MED ORDER — DULOXETINE HCL 20 MG PO CPEP: ORAL_CAPSULE | ORAL | 1 refills | Status: DC

## 2018-12-17 MED ORDER — CLONAZEPAM 0.5 MG PO TABS: 0.5000 mg | ORAL_TABLET | Freq: Two times a day (BID) | ORAL | 2 refills | Status: DC | PRN

## 2018-12-17 NOTE — Progress Notes (Signed)
[facility] [place], [place] [address]  Internal MEDICINE  Office Visit Note  Patient Name: Jo Hampton  [id number]  [id number]  Date of Service: 12/27/2018  Chief Complaint  Patient presents with  . Follow-up    last labs done on 2018    The patient continues to take duloxetine 71m daily. She sates that she is doing much better in relation to depression and anxiety. She states that she has taken the clonazepam at night as needed to help with anxiety and difficulty sleeping. She is taking it most nights except if she consumes alcohol. She will not mix the two together.she does need to have refills today.       Current Medication: Outpatient Encounter Medications as of 12/17/2018  Medication Sig Note  . clonazePAM (KLONOPIN) 0.5 MG tablet Take 1 tablet (0.5 mg total) by mouth 2 (two) times daily as needed for anxiety.   . DULoxetine (CYMBALTA) 20 MG capsule TAKE 1 CAPSULE BY MOUTH EVERY DAY   . Hydrocortisone Acetate 2.5 % CREA Apply 1 application topically 2 (two) times daily as needed.   . [DISCONTINUED] clonazePAM (KLONOPIN) 0.5 MG tablet Take 1 tablet (0.5 mg total) by mouth 2 (two) times daily as needed for anxiety.   . [DISCONTINUED] DULoxetine (CYMBALTA) 20 MG capsule TAKE 1 CAPSULE BY MOUTH EVERY DAY   . ergocalciferol (DRISDOL) 50000 units capsule Take 1 capsule (50,000 Units total) by mouth once a week. (Patient not taking: Reported on 12/17/2018)   . [DISCONTINUED] azithromycin (ZITHROMAX) 250 MG tablet z-pack - take as directed for 5 days for upper respiratory infection. (Patient not taking: Reported on 12/17/2018)   . [DISCONTINUED] fluticasone (FLONASE) 50 MCG/ACT nasal spray Place 2 sprays into both nostrils daily. (Patient not taking: Reported on 12/17/2018)   . [DISCONTINUED] guaiFENesin 200 MG tablet Take 1 tablet (200 mg total) by mouth every 6 (six) hours as needed for cough or to loosen phlegm. (Patient not taking: Reported on 12/17/2018)    . [DISCONTINUED] meloxicam (MOBIC) 15 MG tablet Take 1 tablet (15 mg total) by mouth daily. (Patient not taking: Reported on 12/17/2018)   . [DISCONTINUED] phentermine (ADIPEX-P) 37.5 MG tablet Take 1 tablet (37.5 mg total) by mouth daily before breakfast. (Patient not taking: Reported on 12/17/2018) 12/17/2018: patient is not taking this medication   No facility-administered encounter medications on file as of 12/17/2018.     Surgical History: Past Surgical History:  Procedure Laterality Date  . ovarian  cyst removed      Medical History: History reviewed. No pertinent past medical history.  Family History: Family History  Problem Relation Age of Onset  . Breast cancer Mother 436 . Depression Mother     Social History   Socioeconomic History  . Marital status: Single    Spouse name: Not on file  . Number of children: Not on file  . Years of education: Not on file  . Highest education level: Not on file  Occupational History  . Not on file  Social Needs  . Financial resource strain: Not on file  . Food insecurity    Worry: Not on file    Inability: Not on file  . Transportation needs    Medical: Not on file    Non-medical: Not on file  Tobacco Use  . Smoking status: Never Smoker  . Smokeless tobacco: Never Used  Substance and Sexual Activity  . Alcohol use: Yes    Comment: drinks socially  . Drug  use: No  . Sexual activity: Not on file  Lifestyle  . Physical activity    Days per week: Not on file    Minutes per session: Not on file  . Stress: Not on file  Relationships  . Social Herbalist on phone: Not on file    Gets together: Not on file    Attends religious service: Not on file    Active member of club or organization: Not on file    Attends meetings of clubs or organizations: Not on file    Relationship status: Not on file  . Intimate partner violence    Fear of current or ex partner: Not on file    Emotionally abused: Not on file     Physically abused: Not on file    Forced sexual activity: Not on file  Other Topics Concern  . Not on file  Social History Narrative  . Not on file      Review of Systems  Constitutional: Positive for fatigue. Negative for chills and unexpected weight change.  HENT: Negative for congestion, postnasal drip, rhinorrhea, sinus pressure, sneezing, sore throat and voice change.   Respiratory: Negative for cough, chest tightness, shortness of breath and wheezing.   Cardiovascular: Negative for chest pain and palpitations.  Gastrointestinal: Negative for abdominal pain, constipation, diarrhea, nausea and vomiting.  Endocrine: Negative for cold intolerance, heat intolerance, polydipsia and polyuria.  Musculoskeletal: Positive for arthralgias and myalgias. Negative for back pain, joint swelling and neck pain.       Genrealized joint pain.   Skin: Negative for rash.  Allergic/Immunologic: Negative for environmental allergies.  Neurological: Positive for headaches. Negative for tremors and numbness.  Hematological: Negative for adenopathy. Does not bruise/bleed easily.  Psychiatric/Behavioral: Positive for dysphoric mood. Negative for behavioral problems (Depression), sleep disturbance and suicidal ideas. The patient is nervous/anxious.        Improved since starting on duloxetine 76m daily. Does take clonazepam 0.529mwhen needed for acute insomnia.     Today's Vitals   12/17/18 1540  BP: 115/79  Pulse: 67  Resp: 16  Temp: 98 F (36.7 C)  SpO2: 97%  Weight: 186 lb (84.4 kg)  Height: 5' 3"  (1.6 m)   Body mass index is 32.95 kg/m.  Physical Exam Vitals signs and nursing note reviewed.  Constitutional:      General: She is not in acute distress.    Appearance: Normal appearance. She is well-developed. She is not diaphoretic.  HENT:     Head: Normocephalic and atraumatic.     Nose: Nose normal.     Mouth/Throat:     Pharynx: No oropharyngeal exudate.  Eyes:      Conjunctiva/sclera: Conjunctivae normal.     Pupils: Pupils are equal, round, and reactive to light.  Neck:     Musculoskeletal: Normal range of motion and neck supple.     Thyroid: No thyromegaly.     Vascular: No JVD.     Trachea: No tracheal deviation.  Cardiovascular:     Rate and Rhythm: Normal rate and regular rhythm.     Heart sounds: Normal heart sounds. No murmur. No friction rub. No gallop.   Pulmonary:     Effort: Pulmonary effort is normal. No respiratory distress.     Breath sounds: Normal breath sounds. No wheezing or rales.  Chest:     Chest wall: No tenderness.  Abdominal:     General: Bowel sounds are normal.     Palpations:  Abdomen is soft.     Tenderness: There is no abdominal tenderness.  Musculoskeletal:     Comments: Generalized joint pain with point tenderness present. No visible abnormalities or deformities present at this time.   Lymphadenopathy:     Cervical: No cervical adenopathy.  Skin:    General: Skin is warm and dry.     Capillary Refill: Capillary refill takes less than 2 seconds.  Neurological:     Mental Status: She is alert and oriented to person, place, and time.     Cranial Nerves: No cranial nerve deficit.  Psychiatric:        Behavior: Behavior normal.        Thought Content: Thought content normal.        Judgment: Judgment normal.    Assessment/Plan: 1. Inflammatory polyarthritis ([place]) May take meloxicam as needed and as prescribed   2. Generalized anxiety disorder May continue clonazepam 25m up to twice daily as needed for acute anxiety. New prescription provided today.  - clonazePAM (KLONOPIN) 0.5 MG tablet; Take 1 tablet (0.5 mg total) by mouth 2 (two) times daily as needed for anxiety.  Dispense: 30 tablet; Refill: 2  3. Episode of moderate major depression (HCC) Improved and stable. Continue duloxetine 261mdaily. Refills provided today.  - DULoxetine (CYMBALTA) 20 MG capsule; TAKE 1 CAPSULE BY MOUTH EVERY DAY  Dispense: 90  capsule; Refill: 1  General Counseling: Airi verbalizes understanding of the findings of todays visit and agrees with plan of treatment. I have discussed any further diagnostic evaluation that may be needed or ordered today. We also reviewed her medications today. she has been encouraged to call the office with any questions or concerns that should arise related to todays visit.  This patient was seen by He[facility]tonith Dr FoLavera Guises a part of collaborative care agreement  Meds ordered this encounter  Medications  . clonazePAM (KLONOPIN) 0.5 MG tablet    Sig: Take 1 tablet (0.5 mg total) by mouth 2 (two) times daily as needed for anxiety.    Dispense:  30 tablet    Refill:  2    Order Specific Question:   Supervising Provider    Answer:   KHLavera Guise1[[id number]]. DULoxetine (CYMBALTA) 20 MG capsule    Sig: TAKE 1 CAPSULE BY MOUTH EVERY DAY    Dispense:  90 capsule    Refill:  1    Order Specific Question:   Supervising Provider    Answer:   KHLavera Guise1[[id number]]  Time spent: 1567inutes      Dr FoLavera Guisenternal medicine

## 2018-12-24 ENCOUNTER — Other Ambulatory Visit: Payer: Self-pay | Admitting: Internal Medicine

## 2018-12-24 DIAGNOSIS — R7301 Impaired fasting glucose: Secondary | ICD-10-CM | POA: Diagnosis not present

## 2018-12-24 DIAGNOSIS — Z0001 Encounter for general adult medical examination with abnormal findings: Secondary | ICD-10-CM | POA: Diagnosis not present

## 2018-12-24 DIAGNOSIS — E559 Vitamin D deficiency, unspecified: Secondary | ICD-10-CM | POA: Diagnosis not present

## 2018-12-25 LAB — CBC
Hematocrit: 43.4 % (ref 34.0–46.6)
Hemoglobin: 14.4 g/dL (ref 11.1–15.9)
MCH: 30.7 pg (ref 26.6–33.0)
MCHC: 33.2 g/dL (ref 31.5–35.7)
MCV: 93 fL (ref 79–97)
Platelets: 316 10*3/uL (ref 150–450)
RBC: 4.69 x10E6/uL (ref 3.77–5.28)
RDW: 12.7 % (ref 11.7–15.4)
WBC: 8.9 10*3/uL (ref 3.4–10.8)

## 2018-12-25 LAB — VITAMIN D 25 HYDROXY (VIT D DEFICIENCY, FRACTURES): Vit D, 25-Hydroxy: 22.8 ng/mL — ABNORMAL LOW (ref 30.0–100.0)

## 2018-12-25 LAB — COMPREHENSIVE METABOLIC PANEL
ALT: 14 IU/L (ref 0–32)
AST: 17 IU/L (ref 0–40)
Albumin/Globulin Ratio: 1.7 (ref 1.2–2.2)
Albumin: 4 g/dL (ref 3.8–4.8)
Alkaline Phosphatase: 84 IU/L (ref 39–117)
BUN/Creatinine Ratio: 14 (ref 9–23)
BUN: 14 mg/dL (ref 6–24)
Bilirubin Total: <0.2 mg/dL (ref 0.0–1.2)
CO2: 22 mmol/L (ref 20–29)
Calcium: 9 mg/dL (ref 8.7–10.2)
Chloride: 106 mmol/L (ref 96–106)
Creatinine, Ser: 1.03 mg/dL — ABNORMAL HIGH (ref 0.57–1.00)
GFR calc Af Amer: 77 mL/min/{1.73_m2} (ref >59)
GFR calc non Af Amer: 67 mL/min/{1.73_m2} (ref >59)
Globulin, Total: 2.4 g/dL (ref 1.5–4.5)
Glucose: 106 mg/dL — ABNORMAL HIGH (ref 65–99)
Potassium: 4.3 mmol/L (ref 3.5–5.2)
Sodium: 135 mmol/L (ref 134–144)
Total Protein: 6.4 g/dL (ref 6.0–8.5)

## 2018-12-25 LAB — LIPID PANEL W/O CHOL/HDL RATIO
Cholesterol, Total: 197 mg/dL (ref 100–199)
HDL: 40 mg/dL (ref >39)
LDL Chol Calc (NIH): 135 mg/dL — ABNORMAL HIGH (ref 0–99)
Triglycerides: 124 mg/dL (ref 0–149)
VLDL Cholesterol Cal: 22 mg/dL (ref 5–40)

## 2018-12-25 LAB — T4, FREE: Free T4: 1.09 ng/dL (ref 0.82–1.77)

## 2018-12-25 LAB — HGB A1C W/O EAG: Hgb A1c MFr Bld: 5.7 % — ABNORMAL HIGH (ref 4.8–5.6)

## 2018-12-25 LAB — TSH: TSH: 4.79 u[IU]/mL — ABNORMAL HIGH (ref 0.450–4.500)

## 2018-12-31 NOTE — Progress Notes (Signed)
Left message and asked pt to callback and schedule appt due to lab results, left message not urgent but in the next few weeks are recommended. Beth

## 2018-12-31 NOTE — Progress Notes (Signed)
Can we see her follow up on her Labs in 2 weeks or so

## 2019-01-06 DIAGNOSIS — M545 Low back pain: Secondary | ICD-10-CM | POA: Diagnosis not present

## 2019-01-06 DIAGNOSIS — X500XXA Overexertion from strenuous movement or load, initial encounter: Secondary | ICD-10-CM | POA: Diagnosis not present

## 2019-01-06 DIAGNOSIS — M25562 Pain in left knee: Secondary | ICD-10-CM | POA: Diagnosis not present

## 2019-01-06 DIAGNOSIS — S83412A Sprain of medial collateral ligament of left knee, initial encounter: Secondary | ICD-10-CM | POA: Diagnosis not present

## 2019-01-06 DIAGNOSIS — M25561 Pain in right knee: Secondary | ICD-10-CM | POA: Diagnosis not present

## 2019-01-06 DIAGNOSIS — S83421A Sprain of lateral collateral ligament of right knee, initial encounter: Secondary | ICD-10-CM | POA: Diagnosis not present

## 2019-01-06 DIAGNOSIS — M47816 Spondylosis without myelopathy or radiculopathy, lumbar region: Secondary | ICD-10-CM | POA: Diagnosis not present

## 2019-01-19 ENCOUNTER — Ambulatory Visit (INDEPENDENT_AMBULATORY_CARE_PROVIDER_SITE_OTHER): Payer: BLUE CROSS/BLUE SHIELD | Admitting: Nurse Practitioner

## 2019-01-19 ENCOUNTER — Other Ambulatory Visit: Payer: Self-pay

## 2019-01-19 ENCOUNTER — Encounter: Payer: Self-pay | Admitting: Nurse Practitioner

## 2019-01-19 VITALS — BP 122/71 | HR 71 | Temp 97.3°F | Resp 16 | Ht 63.0 in | Wt 186.0 lb

## 2019-01-19 DIAGNOSIS — E668 Other obesity: Secondary | ICD-10-CM | POA: Diagnosis not present

## 2019-01-19 DIAGNOSIS — M25511 Pain in right shoulder: Secondary | ICD-10-CM | POA: Diagnosis not present

## 2019-01-19 DIAGNOSIS — Z79899 Other long term (current) drug therapy: Secondary | ICD-10-CM | POA: Diagnosis not present

## 2019-01-19 DIAGNOSIS — E039 Hypothyroidism, unspecified: Secondary | ICD-10-CM | POA: Diagnosis not present

## 2019-01-19 DIAGNOSIS — E559 Vitamin D deficiency, unspecified: Secondary | ICD-10-CM | POA: Diagnosis not present

## 2019-01-19 LAB — POCT URINE DRUG SCREEN
POC Amphetamine UR: NOT DETECTED
POC BENZODIAZEPINES UR: NOT DETECTED
POC Barbiturate UR: NOT DETECTED
POC Cocaine UR: NOT DETECTED
POC Ecstasy UR: NOT DETECTED
POC Marijuana UR: NOT DETECTED
POC Methadone UR: NOT DETECTED
POC Methamphetamine UR: NOT DETECTED
POC Opiate Ur: NOT DETECTED
POC Oxycodone UR: NOT DETECTED
POC PHENCYCLIDINE UR: NOT DETECTED
POC TRICYCLICS UR: POSITIVE — AB

## 2019-01-19 MED ORDER — DICLOFENAC SODIUM 1 % TD GEL: 4.0000 g | Freq: Four times a day (QID) | TRANSDERMAL | 2 refills | Status: DC

## 2019-01-19 MED ORDER — LEVOTHYROXINE SODIUM 25 MCG PO TABS: 25.0000 ug | ORAL_TABLET | Freq: Every day | ORAL | 3 refills | Status: DC

## 2019-01-19 MED ORDER — ERGOCALCIFEROL 1.25 MG (50000 UT) PO CAPS: 50000.0000 [IU] | ORAL_CAPSULE | ORAL | 5 refills | Status: DC

## 2019-01-19 MED ORDER — METHYLPREDNISOLONE 4 MG PO TBPK: ORAL_TABLET | ORAL | 0 refills | Status: DC

## 2019-01-19 NOTE — Progress Notes (Signed)
[place], [place] [address]  Internal MEDICINE  Office Visit Note  Patient Name: Jo Hampton  [id number]  [id number]  Date of Service: 02/05/2019  Chief Complaint  Patient presents with  . Neck Pain    pinch nerve   . Follow-up    labs    The patient is here for follow up visit. She recently had routine, fasting labs done. Showing mild hypothyroid as well as vitamin d deficiency. Today, she is complaining of right shoulder pain. Does not remember any specific injury or trauma. Hurts to move the arm vertically or reach behind her to fasten or unfasten her bra.   She has maintained her weight over past several weeks. Continues to limit her calorie intake to 1200-1500 calories per day and she is exercising when possible.       Current Medication: Outpatient Encounter Medications as of 01/19/2019  Medication Sig  . clonazePAM (KLONOPIN) 0.5 MG tablet Take 1 tablet (0.5 mg total) by mouth 2 (two) times daily as needed for anxiety.  . DULoxetine (CYMBALTA) 20 MG capsule TAKE 1 CAPSULE BY MOUTH EVERY DAY  . Hydrocortisone Acetate 2.5 % CREA Apply 1 application topically 2 (two) times daily as needed.  . [DISCONTINUED] ergocalciferol (DRISDOL) 50000 units capsule Take 1 capsule (50,000 Units total) by mouth once a week.  . diclofenac sodium (VOLTAREN) 1 % GEL Apply 4 g topically 4 (four) times daily.  . ergocalciferol (DRISDOL) 1.25 MG (50000 UT) capsule Take 1 capsule (50,000 Units total) by mouth once a week.  . levothyroxine (SYNTHROID) 25 MCG tablet Take 1 tablet (25 mcg total) by mouth daily before breakfast.  . methylPREDNISolone (MEDROL) 4 MG TBPK tablet Take by mouth as directed for 6 days   No facility-administered encounter medications on file as of 01/19/2019.     Surgical History: Past Surgical History:  Procedure Laterality Date  . ovarian  cyst removed      Medical History: History reviewed. No pertinent past medical  history.  Family History: Family History  Problem Relation Age of Onset  . Breast cancer Mother 55  . Depression Mother     Social History   Socioeconomic History  . Marital status: Single    Spouse name: Not on file  . Number of children: Not on file  . Years of education: Not on file  . Highest education level: Not on file  Occupational History  . Not on file  Social Needs  . Financial resource strain: Not on file  . Food insecurity    Worry: Not on file    Inability: Not on file  . Transportation needs    Medical: Not on file    Non-medical: Not on file  Tobacco Use  . Smoking status: Never Smoker  . Smokeless tobacco: Never Used  Substance and Sexual Activity  . Alcohol use: Yes    Comment: drinks socially  . Drug use: No  . Sexual activity: Not on file  Lifestyle  . Physical activity    Days per week: Not on file    Minutes per session: Not on file  . Stress: Not on file  Relationships  . Social Herbalist on phone: Not on file    Gets together: Not on file    Attends religious service: Not on file    Active member of club or organization: Not on file    Attends meetings of clubs or organizations: Not on file  Relationship status: Not on file  . Intimate partner violence    Fear of current or ex partner: Not on file    Emotionally abused: Not on file    Physically abused: Not on file    Forced sexual activity: Not on file  Other Topics Concern  . Not on file  Social History Narrative  . Not on file      Review of Systems  Constitutional: Positive for fatigue. Negative for chills and unexpected weight change.  HENT: Negative for congestion, postnasal drip, rhinorrhea, sinus pressure, sneezing, sore throat and voice change.   Respiratory: Negative for cough, chest tightness, shortness of breath and wheezing.   Cardiovascular: Negative for chest pain and palpitations.  Gastrointestinal: Negative for abdominal pain, constipation,  diarrhea, nausea and vomiting.  Endocrine: Negative for cold intolerance, heat intolerance, polydipsia and polyuria.       Mild hypothyroid on recent labs  Musculoskeletal: Positive for arthralgias and myalgias. Negative for back pain, joint swelling and neck pain.       Right shoulder pain. Hurts more to lift the arm vertically and to reach behind her to fasten and unfasten her bra.   Skin: Negative for rash.  Allergic/Immunologic: Negative for environmental allergies.  Neurological: Positive for headaches. Negative for tremors and numbness.  Hematological: Negative for adenopathy. Does not bruise/bleed easily.  Psychiatric/Behavioral: Positive for dysphoric mood. Negative for behavioral problems (Depression), sleep disturbance and suicidal ideas. The patient is nervous/anxious.        Improved since starting on duloxetine 43m daily. Does take clonazepam 0.572mwhen needed for acute insomnia.     Today's Vitals   01/19/19 1142  BP: 122/71  Pulse: 71  Resp: 16  Temp: (!) 97.3 F (36.3 C)  SpO2: 98%  Weight: 186 lb (84.4 kg)  Height: 5' 3"  (1.6 m)   Body mass index is 32.95 kg/m.  Physical Exam Vitals signs and nursing note reviewed.  Constitutional:      General: She is not in acute distress.    Appearance: Normal appearance. She is well-developed. She is not diaphoretic.  HENT:     Head: Normocephalic and atraumatic.     Nose: Nose normal.     Mouth/Throat:     Pharynx: No oropharyngeal exudate.  Eyes:     Extraocular Movements: Extraocular movements intact.     Conjunctiva/sclera: Conjunctivae normal.     Pupils: Pupils are equal, round, and reactive to light.  Neck:     Musculoskeletal: Normal range of motion and neck supple.     Thyroid: No thyromegaly.     Vascular: No JVD.     Trachea: No tracheal deviation.  Cardiovascular:     Rate and Rhythm: Normal rate and regular rhythm.     Heart sounds: Normal heart sounds. No murmur. No friction rub. No gallop.    Pulmonary:     Effort: Pulmonary effort is normal. No respiratory distress.     Breath sounds: Normal breath sounds. No wheezing or rales.  Chest:     Chest wall: No tenderness.  Abdominal:     General: Bowel sounds are normal.     Palpations: Abdomen is soft.     Tenderness: There is no abdominal tenderness.  Musculoskeletal:     Comments: Generalized joint pain with point tenderness present. No visible abnormalities or deformities present at this time. She does have moderate right shoulder pain. There is mild reduction in ROM and strength of the right shoulder.   Lymphadenopathy:  Cervical: No cervical adenopathy.  Skin:    General: Skin is warm and dry.     Capillary Refill: Capillary refill takes less than 2 seconds.  Neurological:     Mental Status: She is alert and oriented to person, place, and time.     Cranial Nerves: No cranial nerve deficit.  Psychiatric:        Behavior: Behavior normal.        Thought Content: Thought content normal.        Judgment: Judgment normal.    Assessment/Plan: 1. Acute pain of right shoulder Add voltaren gel - use up to four times daily if needed for pain. Also add medrol dose pack. Take as directed for 6 days. Reassess at next visit.  - methylPREDNISolone (MEDROL) 4 MG TBPK tablet; Take by mouth as directed for 6 days  Dispense: 21 tablet; Refill: 0 - diclofenac sodium (VOLTAREN) 1 % GEL; Apply 4 g topically 4 (four) times daily.  Dispense: 100 g; Refill: 2  2. Acquired hypothyroidism Add low dose levothyroxine at 67mg daily. Recheck thyroid panel in 8 weeks and adjust dose as indicated.  - levothyroxine (SYNTHROID) 25 MCG tablet; Take 1 tablet (25 mcg total) by mouth daily before breakfast.  Dispense: 30 tablet; Refill: 3  3. Vitamin D deficiency - ergocalciferol (DRISDOL) 1.25 MG (50000 UT) capsule; Take 1 capsule (50,000 Units total) by mouth once a week.  Dispense: 4 capsule; Refill: 5  4. Moderate obesity Continue to limit  calorie intake to 1200-1500 calories per day and incorporate exercise into daily routine.   5. Encounter for long-term (current) use of medications - POCT Urine Drug Screen negative for all controlled substances.  General Counseling: Cheray verbalizes understanding of the findings of todays visit and agrees with plan of treatment. I have discussed any further diagnostic evaluation that may be needed or ordered today. We also reviewed her medications today. she has been encouraged to call the office with any questions or concerns that should arise related to todays visit.  This patient was seen by HLeretha PolFNP Collaboration with Dr FLavera Guiseas a part of collaborative care agreement  Orders Placed This Encounter  Procedures  . POCT Urine Drug Screen    Meds ordered this encounter  Medications  . methylPREDNISolone (MEDROL) 4 MG TBPK tablet    Sig: Take by mouth as directed for 6 days    Dispense:  21 tablet    Refill:  0    Order Specific Question:   Supervising Provider    Answer:   KLavera Guise[[place] . diclofenac sodium (VOLTAREN) 1 % GEL    Sig: Apply 4 g topically 4 (four) times daily.    Dispense:  100 g    Refill:  2    Order Specific Question:   Supervising Provider    Answer:   KLavera Guise[[[id number]] . ergocalciferol (DRISDOL) 1.25 MG (50000 UT) capsule    Sig: Take 1 capsule (50,000 Units total) by mouth once a week.    Dispense:  4 capsule    Refill:  5    Order Specific Question:   Supervising Provider    Answer:   KLavera Guise[[[id number]] . levothyroxine (SYNTHROID) 25 MCG tablet    Sig: Take 1 tablet (25 mcg total) by mouth daily before breakfast.    Dispense:  30 tablet    Refill:  3    Order Specific Question:   Supervising Provider  AnswerLavera Guise [[id number]]    Time spent: 70 Minutes      Dr Lavera Guise Internal medicine

## 2019-01-27 DIAGNOSIS — G5601 Carpal tunnel syndrome, right upper limb: Secondary | ICD-10-CM | POA: Diagnosis not present

## 2019-01-27 DIAGNOSIS — M5126 Other intervertebral disc displacement, lumbar region: Secondary | ICD-10-CM | POA: Diagnosis not present

## 2019-01-27 DIAGNOSIS — M25541 Pain in joints of right hand: Secondary | ICD-10-CM | POA: Diagnosis not present

## 2019-01-27 DIAGNOSIS — G8929 Other chronic pain: Secondary | ICD-10-CM | POA: Diagnosis not present

## 2019-01-27 DIAGNOSIS — G5621 Lesion of ulnar nerve, right upper limb: Secondary | ICD-10-CM | POA: Diagnosis not present

## 2019-01-27 DIAGNOSIS — M5127 Other intervertebral disc displacement, lumbosacral region: Secondary | ICD-10-CM | POA: Diagnosis not present

## 2019-01-27 DIAGNOSIS — M545 Low back pain: Secondary | ICD-10-CM | POA: Diagnosis not present

## 2019-01-27 DIAGNOSIS — M7711 Lateral epicondylitis, right elbow: Secondary | ICD-10-CM | POA: Diagnosis not present

## 2019-01-29 DIAGNOSIS — M542 Cervicalgia: Secondary | ICD-10-CM | POA: Diagnosis not present

## 2019-01-29 DIAGNOSIS — G5621 Lesion of ulnar nerve, right upper limb: Secondary | ICD-10-CM | POA: Diagnosis not present

## 2019-01-29 DIAGNOSIS — M5412 Radiculopathy, cervical region: Secondary | ICD-10-CM | POA: Diagnosis not present

## 2019-01-29 DIAGNOSIS — M47816 Spondylosis without myelopathy or radiculopathy, lumbar region: Secondary | ICD-10-CM | POA: Diagnosis not present

## 2019-02-05 DIAGNOSIS — M25511 Pain in right shoulder: Secondary | ICD-10-CM | POA: Insufficient documentation

## 2019-02-05 DIAGNOSIS — E039 Hypothyroidism, unspecified: Secondary | ICD-10-CM | POA: Insufficient documentation

## 2019-02-05 DIAGNOSIS — E559 Vitamin D deficiency, unspecified: Secondary | ICD-10-CM | POA: Insufficient documentation

## 2019-02-11 DIAGNOSIS — M545 Low back pain: Secondary | ICD-10-CM | POA: Diagnosis not present

## 2019-02-11 DIAGNOSIS — X58XXXA Exposure to other specified factors, initial encounter: Secondary | ICD-10-CM | POA: Diagnosis not present

## 2019-02-11 DIAGNOSIS — M5412 Radiculopathy, cervical region: Secondary | ICD-10-CM | POA: Diagnosis not present

## 2019-02-11 DIAGNOSIS — G8929 Other chronic pain: Secondary | ICD-10-CM | POA: Diagnosis not present

## 2019-02-11 DIAGNOSIS — M47816 Spondylosis without myelopathy or radiculopathy, lumbar region: Secondary | ICD-10-CM | POA: Diagnosis not present

## 2019-02-11 DIAGNOSIS — M542 Cervicalgia: Secondary | ICD-10-CM | POA: Diagnosis not present

## 2019-02-11 DIAGNOSIS — Y9342 Activity, yoga: Secondary | ICD-10-CM | POA: Diagnosis not present

## 2019-02-12 DIAGNOSIS — G5621 Lesion of ulnar nerve, right upper limb: Secondary | ICD-10-CM | POA: Diagnosis not present

## 2019-02-12 DIAGNOSIS — M25521 Pain in right elbow: Secondary | ICD-10-CM | POA: Diagnosis not present

## 2019-02-12 DIAGNOSIS — M1811 Unilateral primary osteoarthritis of first carpometacarpal joint, right hand: Secondary | ICD-10-CM | POA: Diagnosis not present

## 2019-02-12 DIAGNOSIS — M6281 Muscle weakness (generalized): Secondary | ICD-10-CM | POA: Diagnosis not present

## 2019-02-12 DIAGNOSIS — M79641 Pain in right hand: Secondary | ICD-10-CM | POA: Diagnosis not present

## 2019-02-12 DIAGNOSIS — M7711 Lateral epicondylitis, right elbow: Secondary | ICD-10-CM | POA: Diagnosis not present

## 2019-02-18 DIAGNOSIS — M542 Cervicalgia: Secondary | ICD-10-CM | POA: Diagnosis not present

## 2019-02-18 DIAGNOSIS — M47816 Spondylosis without myelopathy or radiculopathy, lumbar region: Secondary | ICD-10-CM | POA: Diagnosis not present

## 2019-02-24 DIAGNOSIS — G8929 Other chronic pain: Secondary | ICD-10-CM | POA: Diagnosis not present

## 2019-02-24 DIAGNOSIS — M47816 Spondylosis without myelopathy or radiculopathy, lumbar region: Secondary | ICD-10-CM | POA: Diagnosis not present

## 2019-02-24 DIAGNOSIS — M545 Low back pain: Secondary | ICD-10-CM | POA: Diagnosis not present

## 2019-02-24 DIAGNOSIS — M542 Cervicalgia: Secondary | ICD-10-CM | POA: Diagnosis not present

## 2019-02-24 DIAGNOSIS — M5412 Radiculopathy, cervical region: Secondary | ICD-10-CM | POA: Diagnosis not present

## 2019-03-02 DIAGNOSIS — M545 Low back pain: Secondary | ICD-10-CM | POA: Diagnosis not present

## 2019-03-02 DIAGNOSIS — G8929 Other chronic pain: Secondary | ICD-10-CM | POA: Diagnosis not present

## 2019-03-02 DIAGNOSIS — M542 Cervicalgia: Secondary | ICD-10-CM | POA: Diagnosis not present

## 2019-03-02 DIAGNOSIS — M5412 Radiculopathy, cervical region: Secondary | ICD-10-CM | POA: Diagnosis not present

## 2019-03-02 DIAGNOSIS — M47816 Spondylosis without myelopathy or radiculopathy, lumbar region: Secondary | ICD-10-CM | POA: Diagnosis not present

## 2019-03-18 ENCOUNTER — Telehealth: Payer: Self-pay

## 2019-03-18 ENCOUNTER — Other Ambulatory Visit: Payer: Self-pay | Admitting: Nurse Practitioner

## 2019-03-18 DIAGNOSIS — E039 Hypothyroidism, unspecified: Secondary | ICD-10-CM | POA: Diagnosis not present

## 2019-03-18 DIAGNOSIS — E538 Deficiency of other specified B group vitamins: Secondary | ICD-10-CM | POA: Diagnosis not present

## 2019-03-18 NOTE — Telephone Encounter (Signed)
CONFIRMED AND SCREENED FOR 03-22-19 OV.

## 2019-03-19 DIAGNOSIS — G5621 Lesion of ulnar nerve, right upper limb: Secondary | ICD-10-CM | POA: Diagnosis not present

## 2019-03-19 DIAGNOSIS — G5601 Carpal tunnel syndrome, right upper limb: Secondary | ICD-10-CM | POA: Diagnosis not present

## 2019-03-19 DIAGNOSIS — M79601 Pain in right arm: Secondary | ICD-10-CM | POA: Diagnosis not present

## 2019-03-19 LAB — CBC
Hematocrit: 42.4 % (ref 34.0–46.6)
Hemoglobin: 14.3 g/dL (ref 11.1–15.9)
MCH: 31.2 pg (ref 26.6–33.0)
MCHC: 33.7 g/dL (ref 31.5–35.7)
MCV: 92 fL (ref 79–97)
Platelets: 323 10*3/uL (ref 150–450)
RBC: 4.59 x10E6/uL (ref 3.77–5.28)
RDW: 12.7 % (ref 11.7–15.4)
WBC: 8.4 10*3/uL (ref 3.4–10.8)

## 2019-03-19 LAB — T4, FREE: Free T4: 1 ng/dL (ref 0.82–1.77)

## 2019-03-19 LAB — TSH: TSH: 6.21 u[IU]/mL — ABNORMAL HIGH (ref 0.450–4.500)

## 2019-03-22 ENCOUNTER — Encounter: Payer: Self-pay | Admitting: Nurse Practitioner

## 2019-03-22 ENCOUNTER — Telehealth: Payer: Self-pay

## 2019-03-22 ENCOUNTER — Other Ambulatory Visit: Payer: Self-pay

## 2019-03-22 ENCOUNTER — Ambulatory Visit: Payer: BC Managed Care – PPO | Admitting: Nurse Practitioner

## 2019-03-22 VITALS — BP 118/70 | HR 71 | Resp 16 | Ht 63.0 in | Wt 193.0 lb

## 2019-03-22 DIAGNOSIS — E039 Hypothyroidism, unspecified: Secondary | ICD-10-CM | POA: Diagnosis not present

## 2019-03-22 DIAGNOSIS — F321 Major depressive disorder, single episode, moderate: Secondary | ICD-10-CM | POA: Diagnosis not present

## 2019-03-22 DIAGNOSIS — Z6834 Body mass index (BMI) 34.0-34.9, adult: Secondary | ICD-10-CM | POA: Diagnosis not present

## 2019-03-22 MED ORDER — LEVOTHYROXINE SODIUM 50 MCG PO TABS: 50.0000 ug | ORAL_TABLET | Freq: Every day | ORAL | 2 refills | Status: DC

## 2019-03-22 MED ORDER — PHENTERMINE HCL 37.5 MG PO TABS: 37.5000 mg | ORAL_TABLET | Freq: Every day | ORAL | 1 refills | Status: DC

## 2019-03-22 NOTE — Telephone Encounter (Signed)
Pt advised that we increased her levothyroxine to 50 mcg and we mailed labslip

## 2019-03-22 NOTE — Progress Notes (Signed)
[facility] [place], [place] [address]  Internal MEDICINE  Office Visit Note  Patient Name: Jo Hampton  [id number]  [id number]  Date of Service: 03/24/2019  Chief Complaint  Patient presents with  . Follow-up    started levothyroxine (losing hair), review labs     The patient is here for follow up. She was started on levothyroxine 75mg daily. States that she has not really noted any difference. She states that she is losing a little more hair than usual. She did have labs done last Thursday, but results are not available.  Concerned about weight gain. She has had seven pound weight gain since her last visit. Feels like it's "weighing her down." Would like to start phentermine. She has been on this before and did well. She has been off for some time.       Current Medication: Outpatient Encounter Medications as of 03/22/2019  Medication Sig  . clonazePAM (KLONOPIN) 0.5 MG tablet Take 1 tablet (0.5 mg total) by mouth 2 (two) times daily as needed for anxiety.  . diclofenac sodium (VOLTAREN) 1 % GEL Apply 4 g topically 4 (four) times daily.  . DULoxetine (CYMBALTA) 20 MG capsule TAKE 1 CAPSULE BY MOUTH EVERY DAY  . ergocalciferol (DRISDOL) 1.25 MG (50000 UT) capsule Take 1 capsule (50,000 Units total) by mouth once a week.  . Hydrocortisone Acetate 2.5 % CREA Apply 1 application topically 2 (two) times daily as needed.  .Marland Kitchenlevothyroxine (SYNTHROID) 50 MCG tablet Take 1 tablet (50 mcg total) by mouth daily before breakfast.  . [DISCONTINUED] levothyroxine (SYNTHROID) 25 MCG tablet Take 1 tablet (25 mcg total) by mouth daily before breakfast.  . phentermine (ADIPEX-P) 37.5 MG tablet Take 1 tablet (37.5 mg total) by mouth daily before breakfast.  . [DISCONTINUED] methylPREDNISolone (MEDROL) 4 MG TBPK tablet Take by mouth as directed for 6 days (Patient not taking: Reported on 03/22/2019)   No facility-administered encounter medications on file as of 03/22/2019.     Surgical History: Past Surgical History:  Procedure Laterality Date  . ovarian  cyst removed      Medical History: History reviewed. No pertinent past medical history.  Family History: Family History  Problem Relation Age of Onset  . Breast cancer Mother 442 . Depression Mother     Social History   Socioeconomic History  . Marital status: Single    Spouse name: Not on file  . Number of children: Not on file  . Years of education: Not on file  . Highest education level: Not on file  Occupational History  . Not on file  Tobacco Use  . Smoking status: Never Smoker  . Smokeless tobacco: Never Used  Substance and Sexual Activity  . Alcohol use: Yes    Comment: drinks socially  . Drug use: No  . Sexual activity: Not on file  Other Topics Concern  . Not on file  Social History Narrative  . Not on file   Social Determinants of Health   Financial Resource Strain:   . Difficulty of Paying Living Expenses: Not on file  Food Insecurity:   . Worried About RCharity fundraiserin the Last Year: Not on file  . Ran Out of Food in the Last Year: Not on file  Transportation Needs:   . Lack of Transportation (Medical): Not on file  . Lack of Transportation (Non-Medical): Not on file  Physical Activity:   . Days of Exercise per Week: Not on file  .  Minutes of Exercise per Session: Not on file  Stress:   . Feeling of Stress : Not on file  Social Connections:   . Frequency of Communication with Friends and Family: Not on file  . Frequency of Social Gatherings with Friends and Family: Not on file  . Attends Religious Services: Not on file  . Active Member of Clubs or Organizations: Not on file  . Attends Archivist Meetings: Not on file  . Marital Status: Not on file  Intimate Partner Violence:   . Fear of Current or Ex-Partner: Not on file  . Emotionally Abused: Not on file  . Physically Abused: Not on file  . Sexually Abused: Not on file      Review of  Systems  Constitutional: Positive for fatigue. Negative for chills and unexpected weight change.       Seven pound weight gain since her last visit .  HENT: Negative for congestion, postnasal drip, rhinorrhea, sinus pressure, sneezing, sore throat and voice change.   Respiratory: Negative for cough, chest tightness, shortness of breath and wheezing.   Cardiovascular: Negative for chest pain and palpitations.  Gastrointestinal: Negative for abdominal pain, constipation, diarrhea, nausea and vomiting.  Endocrine: Negative for cold intolerance, heat intolerance, polydipsia and polyuria.       Recent thyroid panel indicates more elevated TSH despite starting on levothyroxine 36mg daily.  Musculoskeletal: Positive for arthralgias and myalgias. Negative for back pain, joint swelling and neck pain.       Patient seeing orthopedics for joint pain with stiffness.  Skin: Negative for rash.  Allergic/Immunologic: Negative for environmental allergies.  Neurological: Positive for headaches. Negative for tremors and numbness.  Hematological: Negative for adenopathy. Does not bruise/bleed easily.  Psychiatric/Behavioral: Positive for dysphoric mood. Negative for behavioral problems (Depression), sleep disturbance and suicidal ideas. The patient is nervous/anxious.        Improved since starting on duloxetine 261mdaily. Does take clonazepam 0.50m34mhen needed for acute insomnia.     Today's Vitals   03/22/19 1139  BP: 118/70  Pulse: 71  Resp: 16  SpO2: 97%  Weight: 193 lb (87.5 kg)  Height: 5' 3"  (1.6 m)   Body mass index is 34.19 kg/m.  Physical Exam Vitals and nursing note reviewed.  Constitutional:      General: She is not in acute distress.    Appearance: Normal appearance. She is well-developed. She is not diaphoretic.  HENT:     Head: Normocephalic and atraumatic.     Nose: Nose normal.     Mouth/Throat:     Pharynx: No oropharyngeal exudate.  Eyes:     Conjunctiva/sclera:  Conjunctivae normal.     Pupils: Pupils are equal, round, and reactive to light.  Neck:     Thyroid: No thyromegaly.     Vascular: No JVD.     Trachea: No tracheal deviation.  Cardiovascular:     Rate and Rhythm: Normal rate and regular rhythm.     Heart sounds: Normal heart sounds. No murmur. No friction rub. No gallop.   Pulmonary:     Effort: Pulmonary effort is normal. No respiratory distress.     Breath sounds: Normal breath sounds. No wheezing or rales.  Chest:     Chest wall: No tenderness.  Abdominal:     Palpations: Abdomen is soft.  Musculoskeletal:     Cervical back: Normal range of motion and neck supple.     Comments: Generalized joint pain with point tenderness present. No visible  abnormalities or deformities present at this time.    Lymphadenopathy:     Cervical: No cervical adenopathy.  Skin:    General: Skin is warm and dry.     Capillary Refill: Capillary refill takes less than 2 seconds.  Neurological:     Mental Status: She is alert and oriented to person, place, and time.     Cranial Nerves: No cranial nerve deficit.  Psychiatric:        Behavior: Behavior normal.        Thought Content: Thought content normal.        Judgment: Judgment normal.    Assessment/Plan: 1. Acquired hypothyroidism Increase levothyroxine to 77mg daily. Recheck thyroid panel prior to next visit and adjust dosing as indicated.  - levothyroxine (SYNTHROID) 50 MCG tablet; Take 1 tablet (50 mcg total) by mouth daily before breakfast.  Dispense: 30 tablet; Refill: 2  2. BMI 34.0-34.9,adult Start phentermine. Limit calorie intake to 1200-1500 calories. Encouraged her to incoprorate exercise into daily routine.  - phentermine (ADIPEX-P) 37.5 MG tablet; Take 1 tablet (37.5 mg total) by mouth daily before breakfast.  Dispense: 30 tablet; Refill: 1  3. Episode of moderate major depression (HCC) Stable. Continue duloextine as prescribed   General Counseling: Araeya verbalizes  understanding of the findings of todays visit and agrees with plan of treatment. I have discussed any further diagnostic evaluation that may be needed or ordered today. We also reviewed her medications today. she has been encouraged to call the office with any questions or concerns that should arise related to todays visit.    There is a liability release in patients' chart. There has been a 10 minute discussion about the side effects including but not limited to elevated blood pressure, anxiety, lack of sleep and dry mouth. Pt understands and will like to start/continue on appetite suppressant at this time. There will be one month RX given at the time of visit with proper follow up. Nova diet plan with restricted calories is given to the pt. Pt understands and agrees with  plan of treatment  This patient was seen by HLeretha PolFNP Collaboration with Dr FLavera Guiseas a part of collaborative care agreement  Meds ordered this encounter  Medications  . phentermine (ADIPEX-P) 37.5 MG tablet    Sig: Take 1 tablet (37.5 mg total) by mouth daily before breakfast.    Dispense:  30 tablet    Refill:  1    Order Specific Question:   Supervising Provider    Answer:   KLavera Guise[[place] . levothyroxine (SYNTHROID) 50 MCG tablet    Sig: Take 1 tablet (50 mcg total) by mouth daily before breakfast.    Dispense:  30 tablet    Refill:  2    Please note increased dose    Order Specific Question:   Supervising Provider    Answer:   KLavera Guise[[[id number]]   Total Time spent: 30 Minutes  Time spent with patient included reviewing progress notes, labs, imaging studies, and discussing plan for follow up.       Dr FLavera GuiseInternal medicine

## 2019-03-24 DIAGNOSIS — Z6834 Body mass index (BMI) 34.0-34.9, adult: Secondary | ICD-10-CM | POA: Insufficient documentation

## 2019-03-24 NOTE — Progress Notes (Signed)
Discussed with patient. Increased levothyroxine to 66mg daily. Recheck prior to next visit and adjust as indicated.

## 2019-04-19 ENCOUNTER — Ambulatory Visit: Payer: BLUE CROSS/BLUE SHIELD | Admitting: Nurse Practitioner

## 2019-05-11 ENCOUNTER — Other Ambulatory Visit: Payer: Self-pay

## 2019-05-11 DIAGNOSIS — E039 Hypothyroidism, unspecified: Secondary | ICD-10-CM

## 2019-05-11 MED ORDER — LEVOTHYROXINE SODIUM 50 MCG PO TABS: 50.0000 ug | ORAL_TABLET | Freq: Every day | ORAL | 2 refills | Status: DC

## 2019-05-13 ENCOUNTER — Other Ambulatory Visit: Payer: Self-pay

## 2019-05-13 DIAGNOSIS — E039 Hypothyroidism, unspecified: Secondary | ICD-10-CM

## 2019-05-13 MED ORDER — LEVOTHYROXINE SODIUM 50 MCG PO TABS: 50.0000 ug | ORAL_TABLET | Freq: Every day | ORAL | 5 refills | Status: DC

## 2019-05-21 ENCOUNTER — Ambulatory Visit: Payer: BC Managed Care – PPO | Admitting: Nurse Practitioner

## 2019-05-24 ENCOUNTER — Telehealth: Payer: Self-pay

## 2019-05-24 NOTE — Telephone Encounter (Signed)
She can just stop until she does blood work and comes back in.

## 2019-05-24 NOTE — Telephone Encounter (Signed)
Pt was notified.  

## 2019-05-27 ENCOUNTER — Other Ambulatory Visit: Payer: Self-pay | Admitting: Nurse Practitioner

## 2019-05-27 DIAGNOSIS — E039 Hypothyroidism, unspecified: Secondary | ICD-10-CM | POA: Diagnosis not present

## 2019-05-28 LAB — TSH: TSH: 2.44 u[IU]/mL (ref 0.450–4.500)

## 2019-05-28 LAB — T4, FREE: Free T4: 0.86 ng/dL (ref 0.82–1.77)

## 2019-05-30 ENCOUNTER — Ambulatory Visit: Payer: BLUE CROSS/BLUE SHIELD | Attending: Internal Medicine

## 2019-05-30 DIAGNOSIS — Z23 Encounter for immunization: Secondary | ICD-10-CM

## 2019-05-30 NOTE — Progress Notes (Signed)
   Covid-19 Vaccination Clinic  Name:  Jo Hampton    MRN: [id number] DOB: March 07, 1976  05/30/2019  Jo Hampton was observed post Covid-19 immunization for 15 minutes without incident. She was provided with Vaccine Information Sheet and instruction to access the V-Safe system.   Jo Hampton was instructed to call 911 with any severe reactions post vaccine: Marland Kitchen Difficulty breathing  . Swelling of face and throat  . A fast heartbeat  . A bad rash all over body  . Dizziness and weakness   Immunizations Administered    Name Date Dose VIS Date Route   Pfizer COVID-19 Vaccine 05/30/2019 10:52 AM 0.3 mL 02/19/2019 Intramuscular   Manufacturer: [place]   Lot: [id number]   [place]: [id number]

## 2019-06-02 ENCOUNTER — Telehealth: Payer: Self-pay

## 2019-06-02 NOTE — Telephone Encounter (Signed)
LMOM TO CONFIRM AND SCREEN FOR 06-04-19 OV.

## 2019-06-03 NOTE — Progress Notes (Signed)
Normal thyroid panel. Discuss with patient at visit 06/04/2019

## 2019-06-04 ENCOUNTER — Other Ambulatory Visit: Payer: Self-pay

## 2019-06-04 ENCOUNTER — Encounter: Payer: Self-pay | Admitting: Nurse Practitioner

## 2019-06-04 ENCOUNTER — Ambulatory Visit: Payer: BC Managed Care – PPO | Admitting: Nurse Practitioner

## 2019-06-04 VITALS — BP 114/67 | HR 61 | Temp 97.9°F | Resp 16 | Ht 63.0 in | Wt 188.8 lb

## 2019-06-04 DIAGNOSIS — Z6834 Body mass index (BMI) 34.0-34.9, adult: Secondary | ICD-10-CM

## 2019-06-04 DIAGNOSIS — E039 Hypothyroidism, unspecified: Secondary | ICD-10-CM

## 2019-06-04 DIAGNOSIS — F411 Generalized anxiety disorder: Secondary | ICD-10-CM

## 2019-06-04 MED ORDER — PHENTERMINE HCL 37.5 MG PO TABS: 37.5000 mg | ORAL_TABLET | Freq: Every day | ORAL | 1 refills | Status: DC

## 2019-06-04 NOTE — Progress Notes (Signed)
[facility] [place], [place] [address]  Internal MEDICINE  Office Visit Note  Patient Name: Jo Hampton  [id number]  [id number]  Date of Service: 06/19/2019  Chief Complaint  Patient presents with  . Medical Management of Chronic Issues    weight management   . Hypothyroidism    Patient is here for routine follow up. Taking phentermine every other day.  Has increased outdoor exercise. She has lost five pounds since she was last seen. She reports no negative side effects associated with taking appetite suppressants. Recently checked thyroid panel. Normal thyroid panel. She is no longer taking levothyroxine.       Current Medication: Outpatient Encounter Medications as of 06/04/2019  Medication Sig  . clonazePAM (KLONOPIN) 0.5 MG tablet Take 1 tablet (0.5 mg total) by mouth 2 (two) times daily as needed for anxiety.  . diclofenac sodium (VOLTAREN) 1 % GEL Apply 4 g topically 4 (four) times daily.  . DULoxetine (CYMBALTA) 20 MG capsule TAKE 1 CAPSULE BY MOUTH EVERY DAY  . ergocalciferol (DRISDOL) 1.25 MG (50000 UT) capsule Take 1 capsule (50,000 Units total) by mouth once a week.  . Hydrocortisone Acetate 2.5 % CREA Apply 1 application topically 2 (two) times daily as needed.  Marland Kitchen levothyroxine (SYNTHROID) 50 MCG tablet Take 1 tablet (50 mcg total) by mouth daily before breakfast.  . phentermine (ADIPEX-P) 37.5 MG tablet Take 1 tablet (37.5 mg total) by mouth daily before breakfast.  . [DISCONTINUED] phentermine (ADIPEX-P) 37.5 MG tablet Take 1 tablet (37.5 mg total) by mouth daily before breakfast.   No facility-administered encounter medications on file as of 06/04/2019.    Surgical History: Past Surgical History:  Procedure Laterality Date  . ovarian  cyst removed      Medical History: History reviewed. No pertinent past medical history.  Family History: Family History  Problem Relation Age of Onset  . Breast cancer Mother 76  . Depression  Mother     Social History   Socioeconomic History  . Marital status: Single    Spouse name: Not on file  . Number of children: Not on file  . Years of education: Not on file  . Highest education level: Not on file  Occupational History  . Not on file  Tobacco Use  . Smoking status: Never Smoker  . Smokeless tobacco: Never Used  Substance and Sexual Activity  . Alcohol use: Yes    Comment: drinks socially  . Drug use: No  . Sexual activity: Not on file  Other Topics Concern  . Not on file  Social History Narrative  . Not on file   Social Determinants of Health   Financial Resource Strain:   . Difficulty of Paying Living Expenses:   Food Insecurity:   . Worried About Charity fundraiser in the Last Year:   . Arboriculturist in the Last Year:   Transportation Needs:   . Film/video editor (Medical):   Marland Kitchen Lack of Transportation (Non-Medical):   Physical Activity:   . Days of Exercise per Week:   . Minutes of Exercise per Session:   Stress:   . Feeling of Stress :   Social Connections:   . Frequency of Communication with Friends and Family:   . Frequency of Social Gatherings with Friends and Family:   . Attends Religious Services:   . Active Member of Clubs or Organizations:   . Attends Archivist Meetings:   Marland Kitchen Marital Status:  Intimate Partner Violence:   . Fear of Current or Ex-Partner:   . Emotionally Abused:   Marland Kitchen Physically Abused:   . Sexually Abused:       Review of Systems  Constitutional: Negative for chills, fatigue and unexpected weight change.       Five pound weight loss since her last visit.   HENT: Negative for congestion, postnasal drip, rhinorrhea, sinus pressure, sneezing, sore throat and voice change.   Respiratory: Negative for cough, chest tightness, shortness of breath and wheezing.   Cardiovascular: Negative for chest pain and palpitations.  Gastrointestinal: Negative for abdominal pain, constipation, diarrhea, nausea and  vomiting.  Endocrine: Negative for cold intolerance, heat intolerance, polydipsia and polyuria.  Musculoskeletal: Positive for arthralgias and myalgias. Negative for back pain, joint swelling and neck pain.       Patient seeing orthopedics for joint pain with stiffness.  Skin: Negative for rash.  Allergic/Immunologic: Negative for environmental allergies.  Neurological: Positive for headaches. Negative for tremors and numbness.  Hematological: Negative for adenopathy. Does not bruise/bleed easily.  Psychiatric/Behavioral: Positive for dysphoric mood. Negative for behavioral problems (Depression), sleep disturbance and suicidal ideas. The patient is nervous/anxious.        Improved since starting on duloxetine 57m daily. Does take clonazepam 0.580mwhen needed for acute insomnia.     Today's Vitals   06/04/19 1146  BP: 114/67  Pulse: 61  Resp: 16  Temp: 97.9 F (36.6 C)  SpO2: 97%  Weight: 188 lb 12.8 oz (85.6 kg)  Height: 5' 3"  (1.6 m)   Body mass index is 33.44 kg/m.  Physical Exam Vitals and nursing note reviewed.  Constitutional:      General: She is not in acute distress.    Appearance: Normal appearance. She is well-developed. She is not diaphoretic.  HENT:     Head: Normocephalic and atraumatic.     Nose: Nose normal.     Mouth/Throat:     Pharynx: No oropharyngeal exudate.  Eyes:     Conjunctiva/sclera: Conjunctivae normal.     Pupils: Pupils are equal, round, and reactive to light.  Neck:     Thyroid: No thyromegaly.     Vascular: No JVD.     Trachea: No tracheal deviation.  Cardiovascular:     Rate and Rhythm: Normal rate and regular rhythm.     Heart sounds: Normal heart sounds. No murmur. No friction rub. No gallop.   Pulmonary:     Effort: Pulmonary effort is normal. No respiratory distress.     Breath sounds: Normal breath sounds. No wheezing or rales.  Chest:     Chest wall: No tenderness.  Abdominal:     Palpations: Abdomen is soft.   Musculoskeletal:     Cervical back: Normal range of motion and neck supple.     Comments: Generalized joint pain with point tenderness present. No visible abnormalities or deformities present at this time.    Lymphadenopathy:     Cervical: No cervical adenopathy.  Skin:    General: Skin is warm and dry.     Capillary Refill: Capillary refill takes less than 2 seconds.  Neurological:     Mental Status: She is alert and oriented to person, place, and time.     Cranial Nerves: No cranial nerve deficit.  Psychiatric:        Behavior: Behavior normal.        Thought Content: Thought content normal.        Judgment: Judgment normal.  Assessment/Plan: 1. Acquired hypothyroidism Reviewed recent thyroid panel which was normal without levothyroxine. Will monitor closely.   2. Generalized anxiety disorder Stable. Continue duloxetine daily. May take clonazepam as needed and as prescribed for acute anxiety. Continue to wean down use of clonazepam.   3. BMI 34.0-34.9,adult May continue phentermine daily. Limit calorie intake to 1200 to 1500 calories per day. Continue with increased exercise.  - phentermine (ADIPEX-P) 37.5 MG tablet; Take 1 tablet (37.5 mg total) by mouth daily before breakfast.  Dispense: 30 tablet; Refill: 1  General Counseling: Jo Hampton verbalizes understanding of the findings of todays visit and agrees with plan of treatment. I have discussed any further diagnostic evaluation that may be needed or ordered today. We also reviewed her medications today. she has been encouraged to call the office with any questions or concerns that should arise related to todays visit.    There is a liability release in patients' chart. There has been a 10 minute discussion about the side effects including but not limited to elevated blood pressure, anxiety, lack of sleep and dry mouth. Pt understands and will like to start/continue on appetite suppressant at this time. There will be one month  RX given at the time of visit with proper follow up. Nova diet plan with restricted calories is given to the pt. Pt understands and agrees with  plan of treatment  This patient was seen by Leretha Pol FNP Collaboration with Dr Lavera Guise as a part of collaborative care agreement  Meds ordered this encounter  Medications  . phentermine (ADIPEX-P) 37.5 MG tablet    Sig: Take 1 tablet (37.5 mg total) by mouth daily before breakfast.    Dispense:  30 tablet    Refill:  1    Order Specific Question:   Supervising Provider    Answer:   Lavera Guise [[id number]]    Total time spent: 25 Minutes   Time spent includes review of chart, medications, test results, and follow up plan with the patient.      Dr Lavera Guise Internal medicine

## 2019-06-20 ENCOUNTER — Ambulatory Visit: Payer: Self-pay | Attending: Internal Medicine

## 2019-06-20 DIAGNOSIS — Z23 Encounter for immunization: Secondary | ICD-10-CM

## 2019-06-20 NOTE — Progress Notes (Signed)
   Covid-19 Vaccination Clinic  Name:  Jo Hampton    MRN: [id number] DOB: [birth year]  06/20/2019  [name] was observed post Covid-19 immunization for 15 minutes without incident. She was provided with Vaccine Information Sheet and instruction to access the V-Safe system.   [name] was instructed to call 911 with any severe reactions post vaccine: Marland Kitchen Difficulty breathing  . Swelling of face and throat  . A fast heartbeat  . A bad rash all over body  . Dizziness and weakness   Immunizations Administered    Name Date Dose VIS Date Route   Pfizer COVID-19 Vaccine 06/20/2019 10:35 AM 0.3 mL 02/19/2019 Intramuscular   Manufacturer: [place]   Lot: [id number]   [place]: [id number]

## 2019-07-19 ENCOUNTER — Other Ambulatory Visit: Payer: Self-pay

## 2019-07-19 DIAGNOSIS — E559 Vitamin D deficiency, unspecified: Secondary | ICD-10-CM

## 2019-07-19 MED ORDER — ERGOCALCIFEROL 1.25 MG (50000 UT) PO CAPS: 50000.0000 [IU] | ORAL_CAPSULE | ORAL | 5 refills | Status: DC

## 2019-09-27 DIAGNOSIS — M7542 Impingement syndrome of left shoulder: Secondary | ICD-10-CM | POA: Diagnosis not present

## 2019-09-27 DIAGNOSIS — M222X1 Patellofemoral disorders, right knee: Secondary | ICD-10-CM | POA: Diagnosis not present

## 2019-10-15 DIAGNOSIS — M25512 Pain in left shoulder: Secondary | ICD-10-CM | POA: Diagnosis not present

## 2019-10-15 DIAGNOSIS — G8929 Other chronic pain: Secondary | ICD-10-CM | POA: Diagnosis not present

## 2019-10-29 DIAGNOSIS — M25512 Pain in left shoulder: Secondary | ICD-10-CM | POA: Diagnosis not present

## 2019-10-29 DIAGNOSIS — G8929 Other chronic pain: Secondary | ICD-10-CM | POA: Diagnosis not present

## 2019-11-17 DIAGNOSIS — M654 Radial styloid tenosynovitis [de Quervain]: Secondary | ICD-10-CM | POA: Diagnosis not present

## 2019-11-19 DIAGNOSIS — M25561 Pain in right knee: Secondary | ICD-10-CM | POA: Diagnosis not present

## 2019-11-19 DIAGNOSIS — G8929 Other chronic pain: Secondary | ICD-10-CM | POA: Diagnosis not present

## 2020-02-28 ENCOUNTER — Encounter: Payer: Self-pay | Admitting: Nurse Practitioner

## 2020-02-28 ENCOUNTER — Other Ambulatory Visit: Payer: Self-pay

## 2020-02-28 ENCOUNTER — Ambulatory Visit: Payer: BC Managed Care – PPO | Admitting: Nurse Practitioner

## 2020-02-28 VITALS — BP 112/82 | HR 67 | Temp 97.6°F | Resp 16 | Ht 63.0 in | Wt 179.8 lb

## 2020-02-28 DIAGNOSIS — J029 Acute pharyngitis, unspecified: Secondary | ICD-10-CM | POA: Diagnosis not present

## 2020-02-28 DIAGNOSIS — E559 Vitamin D deficiency, unspecified: Secondary | ICD-10-CM | POA: Diagnosis not present

## 2020-02-28 LAB — POCT RAPID STREP A (OFFICE): Rapid Strep A Screen: NEGATIVE

## 2020-02-28 MED ORDER — ERGOCALCIFEROL 1.25 MG (50000 UT) PO CAPS
50000.0000 [IU] | ORAL_CAPSULE | ORAL | 5 refills | Status: DC
Start: 2020-02-28 — End: 2020-12-13

## 2020-02-28 MED ORDER — AMOXICILLIN 875 MG PO TABS: 875.0000 mg | ORAL_TABLET | Freq: Two times a day (BID) | ORAL | 0 refills | Status: DC

## 2020-02-28 NOTE — Progress Notes (Signed)
[facility] [place], [place] [address]  Internal MEDICINE  Office Visit Note  Patient Name: Jo Hampton  [id number]  [id number]  Date of Service: 02/28/2020   Pt is here for a sick visit.  Chief Complaint  Patient presents with  . Acute Visit  . Sore Throat    Throat burns, coughing, no fever, started over the weekend, pt started losing voice last week     The patient is here for acute visit.  -started with scratchy throat last week. Unchanged for the entire last week. Over this weekend, symptoms became much worse. Throat is sore, she is hoarse, she does have headache. Swallowing is very painful.  -denies fever, shills, or body aches.  -has been fully vaccinated against COVID 19 including booster shot two weeks ago.        Current Medication:  Outpatient Encounter Medications as of 02/28/2020  Medication Sig  . diclofenac sodium (VOLTAREN) 1 % GEL Apply 4 g topically 4 (four) times daily.  . [DISCONTINUED] ergocalciferol (DRISDOL) 1.25 MG (50000 UT) capsule Take 1 capsule (50,000 Units total) by mouth once a week.  Marland Kitchen amoxicillin (AMOXIL) 875 MG tablet Take 1 tablet (875 mg total) by mouth 2 (two) times daily.  . clonazePAM (KLONOPIN) 0.5 MG tablet Take 1 tablet (0.5 mg total) by mouth 2 (two) times daily as needed for anxiety. (Patient not taking: Reported on 02/28/2020)  . DULoxetine (CYMBALTA) 20 MG capsule TAKE 1 CAPSULE BY MOUTH EVERY DAY (Patient not taking: Reported on 02/28/2020)  . ergocalciferol (DRISDOL) 1.25 MG (50000 UT) capsule Take 1 capsule (50,000 Units total) by mouth once a week.  . Hydrocortisone Acetate 2.5 % CREA Apply 1 application topically 2 (two) times daily as needed. (Patient not taking: Reported on 02/28/2020)  . levothyroxine (SYNTHROID) 50 MCG tablet Take 1 tablet (50 mcg total) by mouth daily before breakfast. (Patient not taking: Reported on 02/28/2020)  . phentermine (ADIPEX-P) 37.5 MG tablet Take 1 tablet (37.5 mg  total) by mouth daily before breakfast. (Patient not taking: Reported on 02/28/2020)   No facility-administered encounter medications on file as of 02/28/2020.      Medical History: History reviewed. No pertinent past medical history.  Today's Vitals   02/28/20 1112  BP: 112/82  Pulse: 67  Resp: 16  Temp: 97.6 F (36.4 C)  SpO2: 98%  Weight: 179 lb 12.8 oz (81.6 kg)  Height: 5' 3"  (1.6 m)   Body mass index is 31.85 kg/m.  Review of Systems  Constitutional: Positive for chills and fatigue. Negative for activity change and fever.  HENT: Positive for congestion, ear pain, postnasal drip, rhinorrhea, sinus pain, sore throat and voice change.   Respiratory: Positive for cough. Negative for wheezing.   Cardiovascular: Negative for chest pain and palpitations.  Gastrointestinal: Positive for nausea. Negative for vomiting.  Allergic/Immunologic: Positive for environmental allergies.  Neurological: Positive for headaches.  Hematological: Positive for adenopathy.    Physical Exam Vitals and nursing note reviewed.  Constitutional:      General: She is not in acute distress.    Appearance: Normal appearance. She is well-developed and well-nourished. She is ill-appearing. She is not diaphoretic.  HENT:     Head: Normocephalic and atraumatic.     Right Ear: Tympanic membrane is erythematous.     Left Ear: Tympanic membrane is erythematous.     Nose: Congestion present.     Right Sinus: Frontal sinus tenderness present.     Left Sinus: Frontal sinus  tenderness present.     Mouth/Throat:     Pharynx: Posterior oropharyngeal erythema present. No oropharyngeal exudate.  Eyes:     Extraocular Movements: EOM normal.     Pupils: Pupils are equal, round, and reactive to light.  Neck:     Thyroid: No thyromegaly.     Vascular: No JVD.     Trachea: No tracheal deviation.  Cardiovascular:     Rate and Rhythm: Normal rate and regular rhythm.     Heart sounds: Normal heart sounds. No  murmur heard. No friction rub. No gallop.   Pulmonary:     Effort: Pulmonary effort is normal. No respiratory distress.     Breath sounds: Normal breath sounds. No wheezing or rales.  Chest:     Chest wall: No tenderness.  Abdominal:     Palpations: Abdomen is soft.  Musculoskeletal:        General: Normal range of motion.     Cervical back: Normal range of motion and neck supple.  Lymphadenopathy:     Cervical: Cervical adenopathy present.  Skin:    General: Skin is warm and dry.  Neurological:     Mental Status: She is alert and oriented to person, place, and time.     Cranial Nerves: No cranial nerve deficit.  Psychiatric:        Mood and Affect: Mood and affect and mood normal.        Behavior: Behavior normal.        Thought Content: Thought content normal.        Judgment: Judgment normal.   Assessment/Plan: 1. Sore throat - POCT rapid strep A negative today.   2. Acute pharyngitis, unspecified etiology Start amoxicillin 832m twice daily for 10 days. Rest and increase fluids. Gargle with warm salt water as needed to help relieve sore throat. Continue OTC medication to help with acute symptoms.  - amoxicillin (AMOXIL) 875 MG tablet; Take 1 tablet (875 mg total) by mouth 2 (two) times daily.  Dispense: 20 tablet; Refill: 0  3. Vitamin D deficiency May continue drisdol weekly. - ergocalciferol (DRISDOL) 1.25 MG (50000 UT) capsule; Take 1 capsule (50,000 Units total) by mouth once a week.  Dispense: 4 capsule; Refill: 5   General Counseling: Aarna verbalizes understanding of the findings of todays visit and agrees with plan of treatment. I have discussed any further diagnostic evaluation that may be needed or ordered today. We also reviewed her medications today. she has been encouraged to call the office with any questions or concerns that should arise related to todays visit.    Counseling:  Rest and increase fluids. Continue using OTC medication to control symptoms.    This patient was seen by HLeretha PolFNP Collaboration with Dr FLavera Guiseas a part of collaborative care agreement  Orders Placed This Encounter  Procedures  . POCT rapid strep A    Meds ordered this encounter  Medications  . amoxicillin (AMOXIL) 875 MG tablet    Sig: Take 1 tablet (875 mg total) by mouth 2 (two) times daily.    Dispense:  20 tablet    Refill:  0    Order Specific Question:   Supervising Provider    Answer:   KLavera Guise[[[id number]] . ergocalciferol (DRISDOL) 1.25 MG (50000 UT) capsule    Sig: Take 1 capsule (50,000 Units total) by mouth once a week.    Dispense:  4 capsule    Refill:  5  Order Specific Question:   Supervising Provider    Answer:   Lavera Guise [[id number]]    Time spent: 25 Minutes

## 2020-03-23 ENCOUNTER — Ambulatory Visit (INDEPENDENT_AMBULATORY_CARE_PROVIDER_SITE_OTHER): Payer: BC Managed Care – PPO | Admitting: Nurse Practitioner

## 2020-03-23 ENCOUNTER — Other Ambulatory Visit: Payer: Self-pay

## 2020-03-23 ENCOUNTER — Encounter: Payer: Self-pay | Admitting: Nurse Practitioner

## 2020-03-23 VITALS — BP 122/75 | HR 69 | Temp 97.3°F | Resp 16 | Ht 64.0 in | Wt 177.0 lb

## 2020-03-23 DIAGNOSIS — R300 Dysuria: Secondary | ICD-10-CM

## 2020-03-23 DIAGNOSIS — E039 Hypothyroidism, unspecified: Secondary | ICD-10-CM

## 2020-03-23 DIAGNOSIS — Z0001 Encounter for general adult medical examination with abnormal findings: Secondary | ICD-10-CM | POA: Diagnosis not present

## 2020-03-23 DIAGNOSIS — J302 Other seasonal allergic rhinitis: Secondary | ICD-10-CM | POA: Diagnosis not present

## 2020-03-23 NOTE — Progress Notes (Signed)
[facility] [place], [place] [address]  Internal MEDICINE  Office Visit Note  Patient Name: Jo Hampton  [id number]  [id number]  Date of Service: 03/23/2020   Pt is here for routine health maintenance examination  Chief Complaint  Patient presents with  . Annual Exam     The patient is here for health maintenance. -was treated for upper respiratory infection/sore throat. Completed antibiotic treatment. States that she still has intermittent scratchy throat with mildly swollen lymph nodes. She states that her room mate did have COVID 19 two weeks ago. She was worried about this. The patient was tested on two separate occassions, yesterday being the most recent test. Results were negative. She is fully vaccinated, including booster for COVID 19.  -does not take anything for allergies OTC or otherwise.  -has no new concerns or complaints today.  -she is due to have routine, fasting labs.  -she sees GYN provider and has well woman exam next week.     Current Medication: Outpatient Encounter Medications as of 03/23/2020  Medication Sig  . diclofenac sodium (VOLTAREN) 1 % GEL Apply 4 g topically 4 (four) times daily.  . ergocalciferol (DRISDOL) 1.25 MG (50000 UT) capsule Take 1 capsule (50,000 Units total) by mouth once a week.  . Hydrocortisone Acetate 2.5 % CREA Apply 1 application topically 2 (two) times daily as needed.  . phentermine (ADIPEX-P) 37.5 MG tablet Take 1 tablet (37.5 mg total) by mouth daily before breakfast.  . [DISCONTINUED] amoxicillin (AMOXIL) 875 MG tablet Take 1 tablet (875 mg total) by mouth 2 (two) times daily.  . [DISCONTINUED] levothyroxine (SYNTHROID) 50 MCG tablet Take 1 tablet (50 mcg total) by mouth daily before breakfast.  . clonazePAM (KLONOPIN) 0.5 MG tablet Take 1 tablet (0.5 mg total) by mouth 2 (two) times daily as needed for anxiety. (Patient not taking: No sig reported)  . DULoxetine (CYMBALTA) 20 MG capsule TAKE 1  CAPSULE BY MOUTH EVERY DAY (Patient not taking: No sig reported)   No facility-administered encounter medications on file as of 03/23/2020.    Surgical History: Past Surgical History:  Procedure Laterality Date  . ovarian  cyst removed      Medical History: History reviewed. No pertinent past medical history.  Family History: Family History  Problem Relation Age of Onset  . Breast cancer Mother 81  . Depression Mother       Review of Systems  Constitutional: Negative for activity change, chills, fatigue and unexpected weight change.  HENT: Negative for congestion, postnasal drip, rhinorrhea, sneezing and sore throat.        The patient states that she does get intermittent scratchy throat.   Respiratory: Negative for cough, chest tightness, shortness of breath and wheezing.   Cardiovascular: Negative for chest pain and palpitations.  Gastrointestinal: Negative for abdominal pain, constipation, diarrhea, nausea and vomiting.  Endocrine: Negative for cold intolerance, heat intolerance, polydipsia and polyuria.       History of hypothyroid. No longer taking levothyroxine. Due to have thyroid panel checked.   Genitourinary: Negative for dysuria, frequency and urgency.  Musculoskeletal: Negative for arthralgias, back pain, joint swelling and neck pain.  Skin: Negative for rash.  Allergic/Immunologic: Positive for environmental allergies.  Neurological: Negative for dizziness, tremors, numbness and headaches.  Hematological: Negative for adenopathy. Does not bruise/bleed easily.  Psychiatric/Behavioral: Negative for behavioral problems (Depression), sleep disturbance and suicidal ideas. The patient is not nervous/anxious.      Today's Vitals   03/23/20 9030  BP: 122/75  Pulse: 69  Resp: 16  Temp: (!) 97.3 F (36.3 C)  SpO2: 98%  Weight: 177 lb (80.3 kg)  Height: 5' 4"  (1.626 m)   Body mass index is 30.38 kg/m.  Physical Exam Vitals and nursing note reviewed.   Constitutional:      General: She is not in acute distress.    Appearance: Normal appearance. She is well-developed and well-nourished. She is not diaphoretic.  HENT:     Head: Normocephalic and atraumatic.     Nose: Nose normal.     Mouth/Throat:     Mouth: Oropharynx is clear and moist.     Pharynx: No oropharyngeal exudate.  Eyes:     Extraocular Movements: EOM normal.     Pupils: Pupils are equal, round, and reactive to light.  Neck:     Thyroid: No thyromegaly.     Vascular: No JVD.     Trachea: No tracheal deviation.  Cardiovascular:     Rate and Rhythm: Normal rate and regular rhythm.     Pulses: Normal pulses.     Heart sounds: Normal heart sounds. No murmur heard. No friction rub. No gallop.   Pulmonary:     Effort: Pulmonary effort is normal. No respiratory distress.     Breath sounds: Normal breath sounds. No wheezing or rales.  Chest:     Chest wall: No tenderness.  Abdominal:     General: Bowel sounds are normal.     Palpations: Abdomen is soft.     Tenderness: There is no abdominal tenderness.  Musculoskeletal:        General: Normal range of motion.     Cervical back: Normal range of motion and neck supple.  Lymphadenopathy:     Cervical: No cervical adenopathy.  Skin:    General: Skin is warm and dry.  Neurological:     General: No focal deficit present.     Mental Status: She is alert and oriented to person, place, and time.     Cranial Nerves: No cranial nerve deficit.  Psychiatric:        Mood and Affect: Mood and affect and mood normal.        Behavior: Behavior normal.        Thought Content: Thought content normal.        Judgment: Judgment normal.    LABS: Recent Results (from the past 2160 hour(s))  POCT rapid strep A     Status: Normal   Collection Time: 02/28/20  2:20 PM  Result Value Ref Range   Rapid Strep A Screen Negative Negative    Assessment/Plan:  1. Encounter for general adult medical examination with abnormal  findings Annual health maintenance exam today. Patient scheduled for well woman exam with GYN next week. Order slip given to check routine, fasting labs.   2. Acquired hypothyroidism Check thyroid panel. Treat as indicated.  3. Seasonal allergic rhinitis, unspecified trigger Recommended trial of claritin OTC. Consider allergy testing in future.   4. Dysuria - UA/M w/rflx Culture, Routine   General Counseling: Delania verbalizes understanding of the findings of todays visit and agrees with plan of treatment. I have discussed any further diagnostic evaluation that may be needed or ordered today. We also reviewed her medications today. she has been encouraged to call the office with any questions or concerns that should arise related to todays visit.    Counseling:    Orders Placed This Encounter  Procedures  . UA/M w/rflx Culture, Routine  This patient was seen by Leretha Pol FNP Collaboration with Dr Lavera Guise as a part of collaborative care agreement   Total time spent: 30 Minutes  Time spent includes review of chart, medications, test results, and follow up plan with the patient.     Lavera Guise, MD  Internal Medicine

## 2020-03-28 DIAGNOSIS — Z6833 Body mass index (BMI) 33.0-33.9, adult: Secondary | ICD-10-CM | POA: Diagnosis not present

## 2020-03-28 DIAGNOSIS — Z113 Encounter for screening for infections with a predominantly sexual mode of transmission: Secondary | ICD-10-CM | POA: Diagnosis not present

## 2020-03-28 DIAGNOSIS — Z01419 Encounter for gynecological examination (general) (routine) without abnormal findings: Secondary | ICD-10-CM | POA: Diagnosis not present

## 2020-03-28 DIAGNOSIS — Z1151 Encounter for screening for human papillomavirus (HPV): Secondary | ICD-10-CM | POA: Diagnosis not present

## 2020-03-28 DIAGNOSIS — Z1231 Encounter for screening mammogram for malignant neoplasm of breast: Secondary | ICD-10-CM | POA: Diagnosis not present

## 2020-03-28 LAB — UA/M W/RFLX CULTURE, ROUTINE
Bilirubin, UA: NEGATIVE
Glucose, UA: NEGATIVE
Ketones, UA: NEGATIVE
Nitrite, UA: NEGATIVE
Protein,UA: NEGATIVE
RBC, UA: NEGATIVE
Specific Gravity, UA: 1.014 (ref 1.005–1.030)
Urobilinogen, Ur: 0.2 mg/dL (ref 0.2–1.0)
pH, UA: 6 (ref 5.0–7.5)

## 2020-03-28 LAB — MICROSCOPIC EXAMINATION
Bacteria, UA: NONE SEEN
Casts: NONE SEEN /lpf
RBC, Urine: NONE SEEN /hpf (ref 0–2)

## 2020-03-28 LAB — URINE CULTURE, REFLEX

## 2020-03-31 ENCOUNTER — Other Ambulatory Visit: Payer: Self-pay | Admitting: Nurse Practitioner

## 2020-03-31 DIAGNOSIS — Z202 Contact with and (suspected) exposure to infections with a predominantly sexual mode of transmission: Secondary | ICD-10-CM | POA: Diagnosis not present

## 2020-03-31 DIAGNOSIS — E559 Vitamin D deficiency, unspecified: Secondary | ICD-10-CM | POA: Diagnosis not present

## 2020-04-01 LAB — LIPID PANEL WITH LDL/HDL RATIO
Cholesterol, Total: 181 mg/dL (ref 100–199)
HDL: 44 mg/dL (ref >39)
LDL Chol Calc (NIH): 117 mg/dL — ABNORMAL HIGH (ref 0–99)
LDL/HDL Ratio: 2.7 ratio (ref 0.0–3.2)
Triglycerides: 110 mg/dL (ref 0–149)
VLDL Cholesterol Cal: 20 mg/dL (ref 5–40)

## 2020-04-01 LAB — HIV ANTIBODY (ROUTINE TESTING W REFLEX): HIV Screen 4th Generation wRfx: NONREACTIVE

## 2020-04-01 LAB — COMPREHENSIVE METABOLIC PANEL
ALT: 11 IU/L (ref 0–32)
AST: 13 IU/L (ref 0–40)
Albumin/Globulin Ratio: 1.5 (ref 1.2–2.2)
Albumin: 4 g/dL (ref 3.8–4.8)
Alkaline Phosphatase: 77 IU/L (ref 44–121)
BUN/Creatinine Ratio: 11 (ref 9–23)
BUN: 11 mg/dL (ref 6–24)
Bilirubin Total: 0.3 mg/dL (ref 0.0–1.2)
CO2: 21 mmol/L (ref 20–29)
Calcium: 9.2 mg/dL (ref 8.7–10.2)
Chloride: 106 mmol/L (ref 96–106)
Creatinine, Ser: 1.04 mg/dL — ABNORMAL HIGH (ref 0.57–1.00)
GFR calc Af Amer: 76 mL/min/{1.73_m2} (ref >59)
GFR calc non Af Amer: 66 mL/min/{1.73_m2} (ref >59)
Globulin, Total: 2.6 g/dL (ref 1.5–4.5)
Glucose: 95 mg/dL (ref 65–99)
Potassium: 4.8 mmol/L (ref 3.5–5.2)
Sodium: 141 mmol/L (ref 134–144)
Total Protein: 6.6 g/dL (ref 6.0–8.5)

## 2020-04-01 LAB — VITAMIN D 25 HYDROXY (VIT D DEFICIENCY, FRACTURES): Vit D, 25-Hydroxy: 38.5 ng/mL (ref 30.0–100.0)

## 2020-04-01 LAB — CBC
Hematocrit: 43.9 % (ref 34.0–46.6)
Hemoglobin: 14.4 g/dL (ref 11.1–15.9)
MCH: 30.4 pg (ref 26.6–33.0)
MCHC: 32.8 g/dL (ref 31.5–35.7)
MCV: 93 fL (ref 79–97)
Platelets: 316 10*3/uL (ref 150–450)
RBC: 4.74 x10E6/uL (ref 3.77–5.28)
RDW: 12.3 % (ref 11.7–15.4)
WBC: 9.8 10*3/uL (ref 3.4–10.8)

## 2020-04-01 LAB — RPR QUALITATIVE: RPR Ser Ql: NONREACTIVE

## 2020-04-01 LAB — T4, FREE: Free T4: 1.02 ng/dL (ref 0.82–1.77)

## 2020-04-01 LAB — TSH: TSH: 3.11 u[IU]/mL (ref 0.450–4.500)

## 2020-06-24 IMAGING — MG DIGITAL SCREENING BILATERAL MAMMOGRAM WITH TOMO AND CAD
6 of 10 series · 6 of 30 positions shown · non-contrast
Comparison: Previous exam(s).

CLINICAL DATA: Screening.

EXAM:
DIGITAL SCREENING BILATERAL MAMMOGRAM WITH TOMO AND CAD

[R CC synth-2D (1 of 2)]
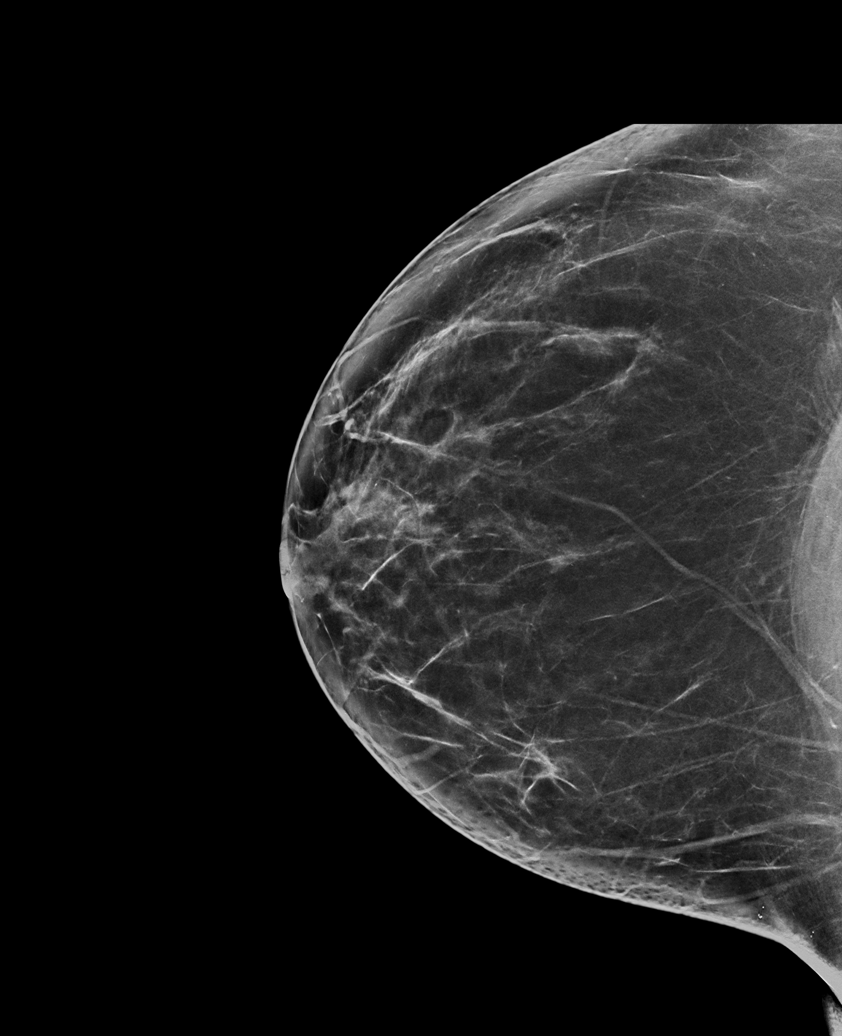

[R MLO synth-2D]
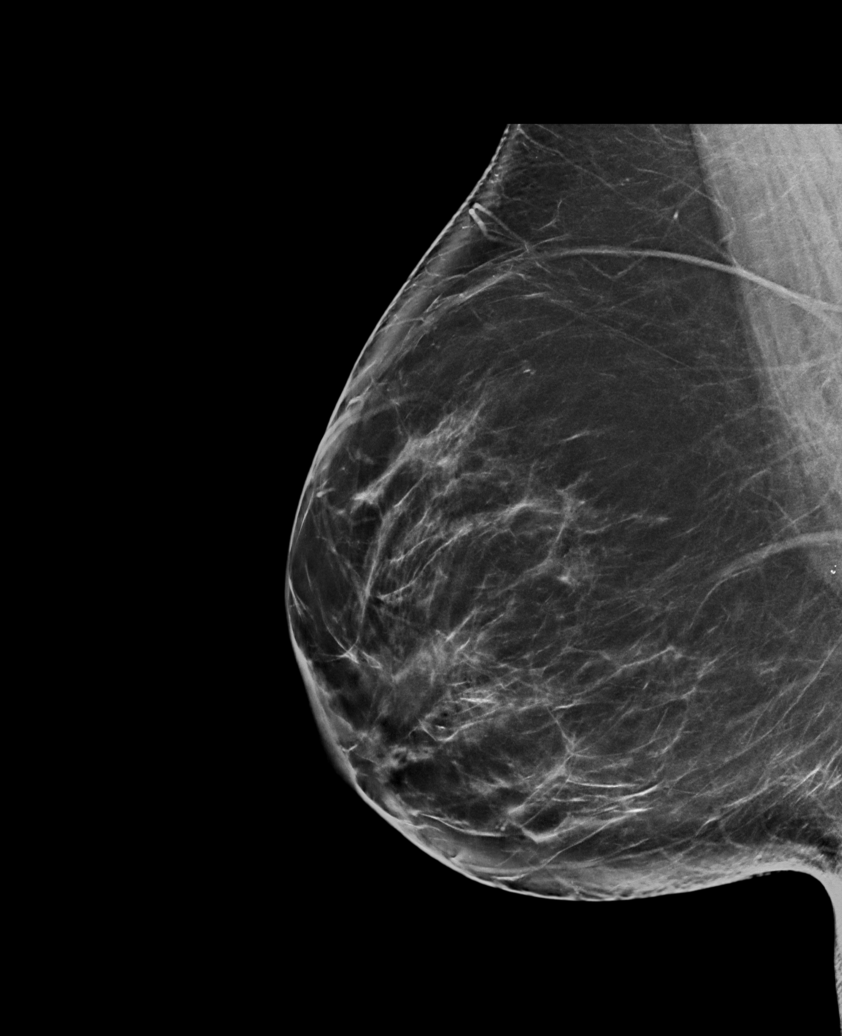

[R CC synth-2D (2 of 2)]
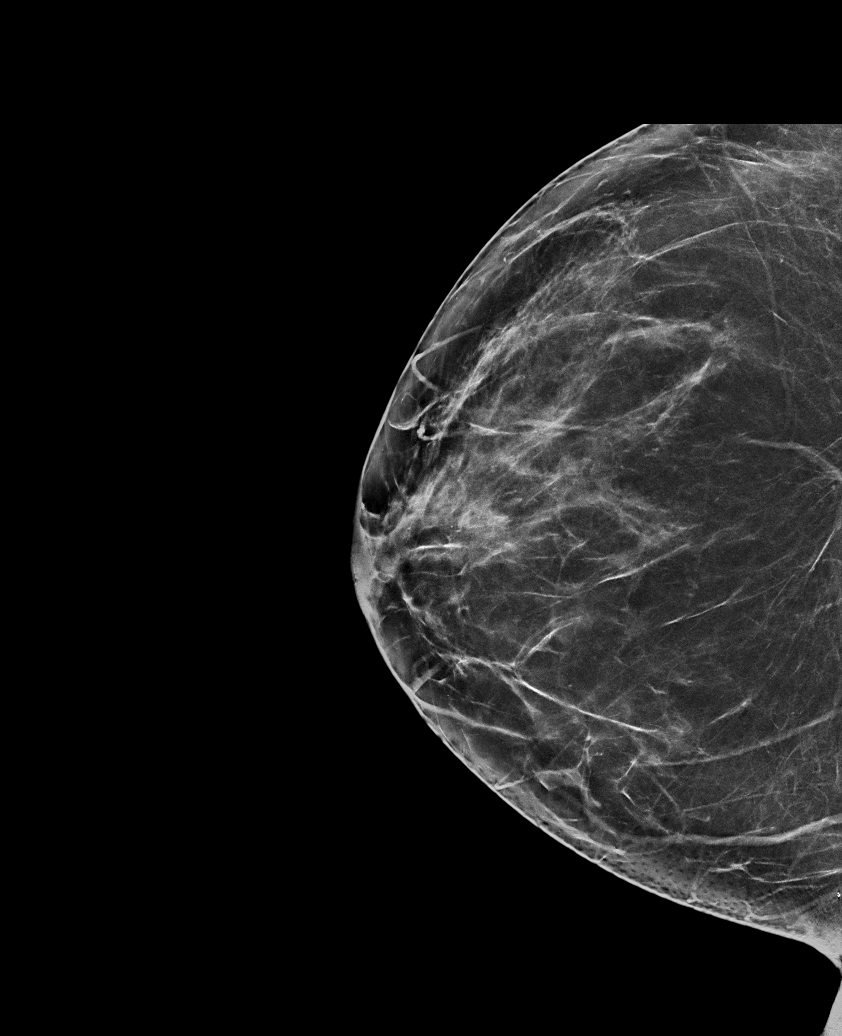

[L CC synth-2D]
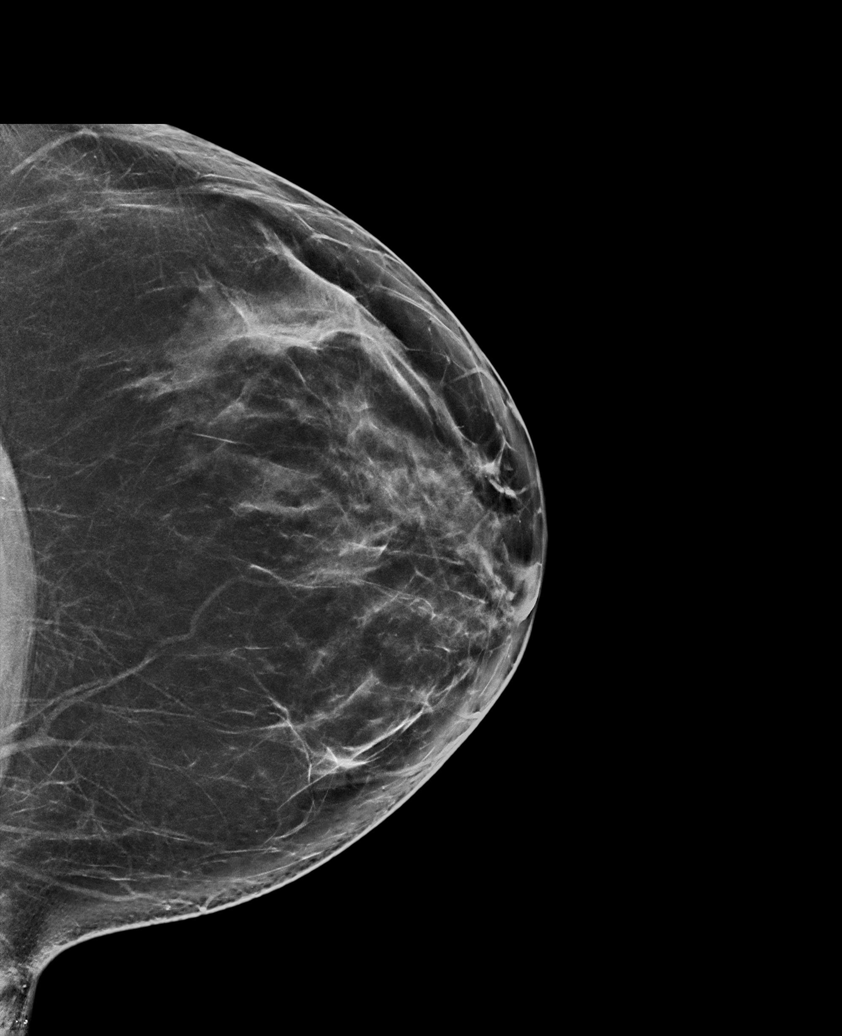

[L MLO synth-2D]
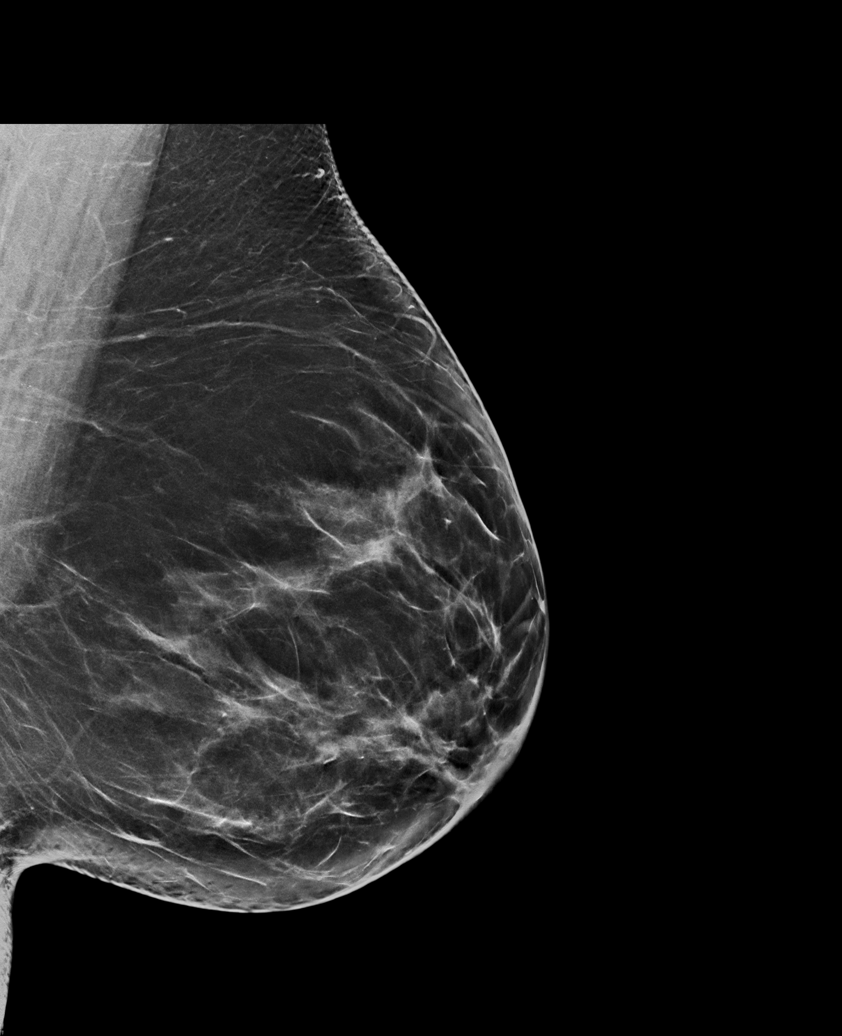

[R CC tomo · tomo slice 39/76.0]
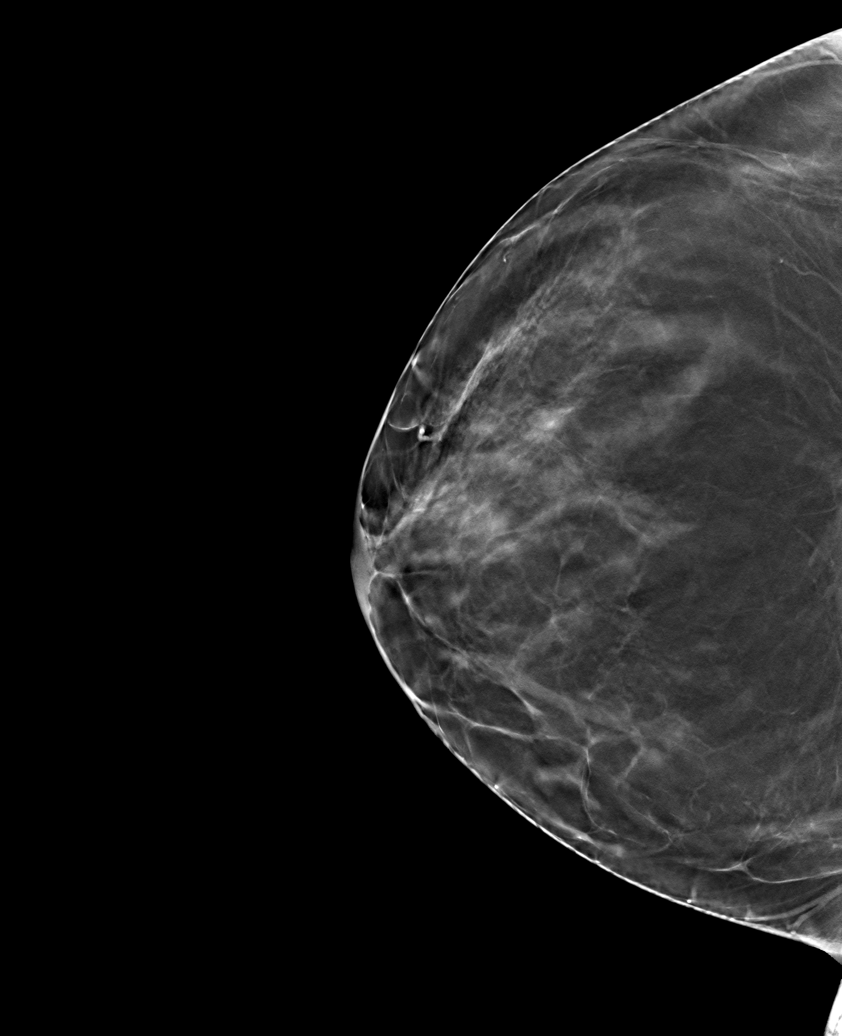

[6 of 30 positions shown; findings below may reference images not displayed]

ACR Breast Density Category b: There are scattered areas of
fibroglandular density.
FINDINGS: There are no findings suspicious for malignancy. Images were
processed with CAD.
IMPRESSION: No mammographic evidence of malignancy. A result letter of this
screening mammogram will be mailed directly to the patient.

RECOMMENDATION:
Screening mammogram in one year. (Code:CN-U-775)

BI-RADS CATEGORY  1: Negative.

## 2020-08-07 ENCOUNTER — Other Ambulatory Visit: Payer: Self-pay | Admitting: Nurse Practitioner

## 2020-08-07 DIAGNOSIS — E559 Vitamin D deficiency, unspecified: Secondary | ICD-10-CM

## 2020-09-14 ENCOUNTER — Other Ambulatory Visit: Payer: Self-pay | Admitting: Nurse Practitioner

## 2020-09-14 DIAGNOSIS — E559 Vitamin D deficiency, unspecified: Secondary | ICD-10-CM

## 2020-09-26 ENCOUNTER — Ambulatory Visit: Payer: BC Managed Care – PPO | Admitting: Physician Assistant

## 2020-10-27 ENCOUNTER — Other Ambulatory Visit: Payer: Self-pay

## 2020-10-27 ENCOUNTER — Ambulatory Visit: Payer: BC Managed Care – PPO | Admitting: Internal Medicine

## 2020-10-27 ENCOUNTER — Encounter: Payer: Self-pay | Admitting: Physician Assistant

## 2020-10-27 VITALS — BP 120/70 | HR 60 | Temp 97.8°F | Resp 16 | Ht 63.0 in | Wt 185.0 lb

## 2020-10-27 DIAGNOSIS — E039 Hypothyroidism, unspecified: Secondary | ICD-10-CM | POA: Diagnosis not present

## 2020-10-27 DIAGNOSIS — Z1231 Encounter for screening mammogram for malignant neoplasm of breast: Secondary | ICD-10-CM

## 2020-10-27 DIAGNOSIS — A6009 Herpesviral infection of other urogenital tract: Secondary | ICD-10-CM | POA: Diagnosis not present

## 2020-10-27 DIAGNOSIS — M25511 Pain in right shoulder: Secondary | ICD-10-CM

## 2020-10-27 MED ORDER — FAMCICLOVIR 500 MG PO TABS: 500.0000 mg | ORAL_TABLET | Freq: Every day | ORAL | 3 refills | Status: DC

## 2020-10-27 MED ORDER — LEVOTHYROXINE SODIUM 50 MCG PO TABS: 50.0000 ug | ORAL_TABLET | Freq: Every day | ORAL | 3 refills | Status: DC

## 2020-10-27 NOTE — Progress Notes (Signed)
[facility] [place], [place] [address]  Internal MEDICINE  Office Visit Note  Patient Name: Jo Hampton  [id number]  [id number]  Date of Service: 10/27/2020  Chief Complaint  Patient presents with   Follow-up    Thyroid med  and vitamin d med     HPI Pt is here for routine follow up  Patient was supposed to be on Synthroid however it was stopped and her TSH has been elevated in the past. Patient continues to have breakout of vesicles on her very vaginal area does have a history of herpes simplex would like to start suppressive therapy.  She has been taking only as needed antiviral medications. Patient has complaints of arthritic symptoms in multiple joints.  She she gets intra-articular corticosteroid injection for her shoulder Patient is also due for her mammogram  Current Medication: Outpatient Encounter Medications as of 10/27/2020  Medication Sig   diclofenac sodium (VOLTAREN) 1 % GEL Apply 4 g topically 4 (four) times daily.   famciclovir (FAMVIR) 500 MG tablet Take 1 tablet (500 mg total) by mouth daily.   Hydrocortisone Acetate 2.5 % CREA Apply 1 application topically 2 (two) times daily as needed.   levothyroxine (SYNTHROID) 50 MCG tablet Take 1 tablet (50 mcg total) by mouth daily. On empty stomach   [DISCONTINUED] phentermine (ADIPEX-P) 37.5 MG tablet Take 1 tablet (37.5 mg total) by mouth daily before breakfast.   ergocalciferol (DRISDOL) 1.25 MG (50000 UT) capsule Take 1 capsule (50,000 Units total) by mouth once a week. (Patient not taking: Reported on 10/27/2020)   [DISCONTINUED] clonazePAM (KLONOPIN) 0.5 MG tablet Take 1 tablet (0.5 mg total) by mouth 2 (two) times daily as needed for anxiety. (Patient not taking: No sig reported)   [DISCONTINUED] DULoxetine (CYMBALTA) 20 MG capsule TAKE 1 CAPSULE BY MOUTH EVERY DAY (Patient not taking: No sig reported)   No facility-administered encounter medications on file as of 10/27/2020.    Surgical  History: Past Surgical History:  Procedure Laterality Date   ovarian  cyst removed      Medical History: History reviewed. No pertinent past medical history.  Family History: Family History  Problem Relation Age of Onset   Breast cancer Mother 69   Depression Mother     Social History   Socioeconomic History   Marital status: Single    Spouse name: Not on file   Number of children: Not on file   Years of education: Not on file   Highest education level: Not on file  Occupational History   Not on file  Tobacco Use   Smoking status: Never   Smokeless tobacco: Never  Vaping Use   Vaping Use: Never used  Substance and Sexual Activity   Alcohol use: Yes    Comment: drinks socially   Drug use: No   Sexual activity: Not on file  Other Topics Concern   Not on file  Social History Narrative   Not on file   Social Determinants of Health   Financial Resource Strain: Not on file  Food Insecurity: Not on file  Transportation Needs: Not on file  Physical Activity: Not on file  Stress: Not on file  Social Connections: Not on file  Intimate Partner Violence: Not on file      Review of Systems  Constitutional:  Negative for chills, fatigue and unexpected weight change.  HENT:  Positive for postnasal drip. Negative for congestion, rhinorrhea, sneezing and sore throat.   Eyes:  Negative for redness.  Respiratory:  Negative for cough, chest tightness and shortness of breath.   Cardiovascular:  Negative for chest pain and palpitations.  Gastrointestinal:  Negative for abdominal pain, constipation, diarrhea, nausea and vomiting.  Genitourinary:  Negative for dysuria and frequency.  Musculoskeletal:  Negative for arthralgias, back pain, joint swelling and neck pain.  Skin:  Negative for rash.  Neurological: Negative.  Negative for tremors and numbness.  Hematological:  Negative for adenopathy. Does not bruise/bleed easily.  Psychiatric/Behavioral:  Negative for behavioral  problems (Depression), sleep disturbance and suicidal ideas. The patient is not nervous/anxious.    Vital Signs: BP 120/70   Pulse 60   Temp 97.8 F (36.6 C)   Resp 16   Ht 5' 3"  (1.6 m)   Wt 185 lb (83.9 kg)   SpO2 98%   BMI 32.77 kg/m    Physical Exam Constitutional:      Appearance: Normal appearance.  HENT:     Head: Normocephalic and atraumatic.     Nose: Nose normal.     Mouth/Throat:     Mouth: Mucous membranes are moist.     Pharynx: No posterior oropharyngeal erythema.  Eyes:     Extraocular Movements: Extraocular movements intact.     Pupils: Pupils are equal, round, and reactive to light.  Cardiovascular:     Pulses: Normal pulses.     Heart sounds: Normal heart sounds.  Pulmonary:     Effort: Pulmonary effort is normal.     Breath sounds: Normal breath sounds.  Musculoskeletal:        General: Tenderness present.  Neurological:     General: No focal deficit present.     Mental Status: She is alert.  Psychiatric:        Mood and Affect: Mood normal.        Behavior: Behavior normal.       Assessment/Plan: 1. Acquired hypothyroidism Previous history of hypothyroidism we will restart Synthroid and will get TSH and free T4 in about 3 months - levothyroxine (SYNTHROID) 50 MCG tablet; Take 1 tablet (50 mcg total) by mouth daily. On empty stomach  Dispense: 90 tablet; Refill: 3  2. Herpes genitalis in women Will start suppressive therapy with Famvir 500 mg once a day. patient is instructed to keep a log of her breakouts at this time for further management - famciclovir (FAMVIR) 500 MG tablet; Take 1 tablet (500 mg total) by mouth daily.  Dispense: 90 tablet; Refill: 3  3. Visit for screening mammogram Mammogram will be scheduled - MM DIGITAL SCREENING BILATERAL; Future  4. Pain in joint of right shoulder Patient is advised against still getting frequent intra-articular corticosteroid injection, we will get a sed rate rheumatoid factor and other  connective tissue disease work-up. in the meantime patient will continue to use Voltaren gel to the shoulder as needed - Sed Rate (ESR) - Rheumatoid Factor - ANA w/Reflex if Positive; Future - Uric acid   General Counseling: Arcelia verbalizes understanding of the findings of todays visit and agrees with plan of treatment. I have discussed any further diagnostic evaluation that may be needed or ordered today. We also reviewed her medications today. she has been encouraged to call the office with any questions or concerns that should arise related to todays visit.    Orders Placed This Encounter  Procedures   MM DIGITAL SCREENING BILATERAL   Sed Rate (ESR)   Rheumatoid Factor   ANA w/Reflex if Positive   Uric acid    Meds ordered  this encounter  Medications   levothyroxine (SYNTHROID) 50 MCG tablet    Sig: Take 1 tablet (50 mcg total) by mouth daily. On empty stomach    Dispense:  90 tablet    Refill:  3   famciclovir (FAMVIR) 500 MG tablet    Sig: Take 1 tablet (500 mg total) by mouth daily.    Dispense:  90 tablet    Refill:  3    Total time spent:35 Minutes Time spent includes review of chart, medications, test results, and follow up plan with the patient.   [place] Controlled Substance Database was reviewed by me.   Dr Lavera Guise Internal medicine

## 2020-11-02 ENCOUNTER — Other Ambulatory Visit: Payer: Self-pay

## 2020-11-02 ENCOUNTER — Telehealth: Payer: Self-pay

## 2020-11-02 MED ORDER — ACYCLOVIR 400 MG PO TABS: 400.0000 mg | ORAL_TABLET | Freq: Two times a day (BID) | ORAL | 3 refills | Status: DC

## 2020-11-02 NOTE — Telephone Encounter (Signed)
As per dr Humphrey Rolls d/c famciclovir  and send acyclovir 400 mg to [facility] for 90 days with 3 refills and pt was advised for change

## 2020-11-07 ENCOUNTER — Other Ambulatory Visit: Payer: Self-pay

## 2020-11-07 ENCOUNTER — Ambulatory Visit
Admission: RE | Admit: 2020-11-07 | Discharge: 2020-11-07 | Disposition: A | Discharge status: Home / Self Care | Payer: BC Managed Care – PPO | Source: Ambulatory Visit | Attending: Internal Medicine | Admitting: Internal Medicine

## 2020-11-07 DIAGNOSIS — Z1231 Encounter for screening mammogram for malignant neoplasm of breast: Secondary | ICD-10-CM

## 2020-12-13 ENCOUNTER — Other Ambulatory Visit: Payer: Self-pay

## 2020-12-13 ENCOUNTER — Encounter: Payer: Self-pay | Admitting: Nurse Practitioner

## 2020-12-13 ENCOUNTER — Ambulatory Visit: Payer: BC Managed Care – PPO | Admitting: Nurse Practitioner

## 2020-12-13 VITALS — BP 130/80 | HR 73 | Temp 97.8°F | Resp 16 | Ht 63.0 in | Wt 179.0 lb

## 2020-12-13 DIAGNOSIS — L304 Erythema intertrigo: Secondary | ICD-10-CM | POA: Diagnosis not present

## 2020-12-13 DIAGNOSIS — E559 Vitamin D deficiency, unspecified: Secondary | ICD-10-CM

## 2020-12-13 DIAGNOSIS — E039 Hypothyroidism, unspecified: Secondary | ICD-10-CM | POA: Diagnosis not present

## 2020-12-13 MED ORDER — ERGOCALCIFEROL 1.25 MG (50000 UT) PO CAPS: 50000.0000 [IU] | ORAL_CAPSULE | ORAL | 5 refills | Status: DC

## 2020-12-13 MED ORDER — MUPIROCIN 2 % EX OINT: 1.0000 "application " | TOPICAL_OINTMENT | Freq: Two times a day (BID) | CUTANEOUS | 2 refills | Status: DC

## 2020-12-13 MED ORDER — ALBUTEROL SULFATE HFA 108 (90 BASE) MCG/ACT IN AERS: 2.0000 | INHALATION_SPRAY | RESPIRATORY_TRACT | 0 refills | Status: DC | PRN

## 2020-12-13 NOTE — Progress Notes (Signed)
[facility] [place], [place] [address]  Internal MEDICINE  Office Visit Note  Patient Name: Jo Hampton  [id number]  [id number]  Date of Service: 12/31/2020  Chief Complaint  Patient presents with   Acute Visit   Cyst    Vaginal area is on and off  when workout     HPI Jo Hampton presents to the clinic for an acute sick visit for a cyst on her vaginal/groin area. She has an issue with the skin of her inner thighs rubbing together when she is walking, jogging or running. The cyst had resolved before her appointment today so there was no cyst to assess.    Current Medication: Outpatient Encounter Medications as of 12/13/2020  Medication Sig   albuterol (VENTOLIN HFA) 108 (90 Base) MCG/ACT inhaler Inhale 2 puffs into the lungs every 4 (four) hours as needed for wheezing or shortness of breath.   mupirocin ointment (BACTROBAN) 2 % Apply 1 application topically 2 (two) times daily.   acyclovir (ZOVIRAX) 400 MG tablet Take 1 tablet (400 mg total) by mouth 2 (two) times daily.   diclofenac sodium (VOLTAREN) 1 % GEL Apply 4 g topically 4 (four) times daily.   ergocalciferol (DRISDOL) 1.25 MG (50000 UT) capsule Take 1 capsule (50,000 Units total) by mouth once a week.   Hydrocortisone Acetate 2.5 % CREA Apply 1 application topically 2 (two) times daily as needed.   levothyroxine (SYNTHROID) 50 MCG tablet Take 1 tablet (50 mcg total) by mouth daily. On empty stomach   [DISCONTINUED] ergocalciferol (DRISDOL) 1.25 MG (50000 UT) capsule Take 1 capsule (50,000 Units total) by mouth once a week. (Patient not taking: Reported on 10/27/2020)   No facility-administered encounter medications on file as of 12/13/2020.    Surgical History: Past Surgical History:  Procedure Laterality Date   ovarian  cyst removed      Medical History: History reviewed. No pertinent past medical history.  Family History: Family History  Problem Relation Age of Onset   Breast cancer Mother  31   Depression Mother     Social History   Socioeconomic History   Marital status: Single    Spouse name: Not on file   Number of children: Not on file   Years of education: Not on file   Highest education level: Not on file  Occupational History   Not on file  Tobacco Use   Smoking status: Never   Smokeless tobacco: Never  Vaping Use   Vaping Use: Never used  Substance and Sexual Activity   Alcohol use: Yes    Comment: drinks socially   Drug use: No   Sexual activity: Not on file  Other Topics Concern   Not on file  Social History Narrative   Not on file   Social Determinants of Health   Financial Resource Strain: Not on file  Food Insecurity: Not on file  Transportation Needs: Not on file  Physical Activity: Not on file  Stress: Not on file  Social Connections: Not on file  Intimate Partner Violence: Not on file      Review of Systems  Constitutional:  Negative for chills, fatigue and unexpected weight change.  HENT:  Negative for congestion, postnasal drip, rhinorrhea, sneezing and sore throat.   Eyes:  Negative for redness.  Respiratory:  Negative for cough, chest tightness and shortness of breath.   Cardiovascular:  Negative for chest pain and palpitations.  Gastrointestinal:  Negative for abdominal pain, constipation, diarrhea, nausea and vomiting.  Genitourinary:  Negative for dysuria and frequency.  Musculoskeletal:  Negative for arthralgias, back pain, joint swelling and neck pain.  Skin:  Negative for rash.  Neurological: Negative.  Negative for tremors and numbness.  Hematological:  Negative for adenopathy. Does not bruise/bleed easily.  Psychiatric/Behavioral:  Negative for behavioral problems (Depression), sleep disturbance and suicidal ideas. The patient is not nervous/anxious.    Vital Signs: BP 130/80   Pulse 73   Temp 97.8 F (36.6 C)   Resp 16   Ht 5' 3"  (1.6 m)   Wt 179 lb (81.2 kg)   SpO2 98%   BMI 31.71 kg/m    Physical  Exam Constitutional:      General: She is not in acute distress.    Appearance: Normal appearance. She is obese. She is not ill-appearing.  HENT:     Head: Normocephalic and atraumatic.  Eyes:     Extraocular Movements: Extraocular movements intact.     Pupils: Pupils are equal, round, and reactive to light.  Cardiovascular:     Rate and Rhythm: Normal rate and regular rhythm.  Pulmonary:     Effort: Pulmonary effort is normal. No respiratory distress.  Neurological:     Mental Status: She is alert and oriented to person, place, and time.     Cranial Nerves: No cranial nerve deficit.     Coordination: Coordination normal.     Gait: Gait normal.  Psychiatric:        Mood and Affect: Mood normal.        Behavior: Behavior normal.       Assessment/Plan: 1. Acquired hypothyroidism Repeat thyroid levels - TSH + free T4  2. Chafing Mupirocin ointment prescribed for when she has an inflammed cyst or folliculitis due to chafing. Encouraged patient to use a skin protectant on inner thighs where they rub together which could be deodorant/antiperspirant, or specific products made to apply in that area to prevent chafing.  - mupirocin ointment (BACTROBAN) 2 %; Apply 1 application topically 2 (two) times daily.  Dispense: 30 g; Refill: 2  3. Vitamin D deficiency vitamin D supplement refilled. - ergocalciferol (DRISDOL) 1.25 MG (50000 UT) capsule; Take 1 capsule (50,000 Units total) by mouth once a week.  Dispense: 4 capsule; Refill: 5   General Counseling: Ivori verbalizes understanding of the findings of todays visit and agrees with plan of treatment. I have discussed any further diagnostic evaluation that may be needed or ordered today. We also reviewed her medications today. she has been encouraged to call the office with any questions or concerns that should arise related to todays visit.    Orders Placed This Encounter  Procedures   TSH + free T4    Meds ordered this  encounter  Medications   ergocalciferol (DRISDOL) 1.25 MG (50000 UT) capsule    Sig: Take 1 capsule (50,000 Units total) by mouth once a week.    Dispense:  4 capsule    Refill:  5   mupirocin ointment (BACTROBAN) 2 %    Sig: Apply 1 application topically 2 (two) times daily.    Dispense:  30 g    Refill:  2   albuterol (VENTOLIN HFA) 108 (90 Base) MCG/ACT inhaler    Sig: Inhale 2 puffs into the lungs every 4 (four) hours as needed for wheezing or shortness of breath.    Dispense:  8 g    Refill:  0    Return if symptoms worsen or fail to improve.  Total time spent:20 Minutes Time spent includes review of chart, medications, test results, and follow up plan with the patient.   [place] Controlled Substance Database was reviewed by me.  This patient was seen by Jonetta Osgood, FNP-C in collaboration with Dr. Clayborn Bigness as a part of collaborative care agreement.   Brooklin Rieger R. Valetta Fuller, MSN, FNP-C Internal medicine

## 2021-02-12 DIAGNOSIS — M47816 Spondylosis without myelopathy or radiculopathy, lumbar region: Secondary | ICD-10-CM | POA: Diagnosis not present

## 2021-03-08 ENCOUNTER — Encounter: Payer: BC Managed Care – PPO | Admitting: Physician Assistant

## 2021-03-21 DIAGNOSIS — M25511 Pain in right shoulder: Secondary | ICD-10-CM | POA: Diagnosis not present

## 2021-03-21 DIAGNOSIS — E039 Hypothyroidism, unspecified: Secondary | ICD-10-CM | POA: Diagnosis not present

## 2021-03-22 LAB — URIC ACID: Uric Acid: 4.1 mg/dL (ref 2.6–6.2)

## 2021-03-22 LAB — ANA W/REFLEX IF POSITIVE: Anti Nuclear Antibody (ANA): NEGATIVE

## 2021-03-22 LAB — RHEUMATOID FACTOR: Rheumatoid fact SerPl-aCnc: <10 IU/mL (ref <14.0)

## 2021-03-22 LAB — TSH+FREE T4
Free T4: 0.92 ng/dL (ref 0.82–1.77)
TSH: 2.85 u[IU]/mL (ref 0.450–4.500)

## 2021-03-22 LAB — SEDIMENTATION RATE: Sed Rate: 11 mm/hr (ref 0–32)

## 2021-03-23 ENCOUNTER — Other Ambulatory Visit: Payer: Self-pay

## 2021-03-23 ENCOUNTER — Telehealth: Payer: Self-pay

## 2021-03-23 ENCOUNTER — Ambulatory Visit: Payer: BC Managed Care – PPO | Admitting: Physician Assistant

## 2021-03-23 ENCOUNTER — Encounter: Payer: Self-pay | Admitting: Physician Assistant

## 2021-03-23 VITALS — BP 127/76 | HR 55 | Temp 98.0°F | Resp 16 | Ht 63.0 in | Wt 190.8 lb

## 2021-03-23 DIAGNOSIS — E039 Hypothyroidism, unspecified: Secondary | ICD-10-CM | POA: Diagnosis not present

## 2021-03-23 DIAGNOSIS — N644 Mastodynia: Secondary | ICD-10-CM

## 2021-03-23 DIAGNOSIS — B351 Tinea unguium: Secondary | ICD-10-CM | POA: Diagnosis not present

## 2021-03-23 DIAGNOSIS — E669 Obesity, unspecified: Secondary | ICD-10-CM | POA: Diagnosis not present

## 2021-03-23 MED ORDER — LEVOTHYROXINE SODIUM 25 MCG PO TABS: 25.0000 ug | ORAL_TABLET | Freq: Every day | ORAL | 1 refills | Status: DC

## 2021-03-23 NOTE — Telephone Encounter (Signed)
Mammogram & ultrasound orders faxed to [facility] [phone]

## 2021-03-23 NOTE — Progress Notes (Signed)
[facility] [place],  [address]  Internal MEDICINE  Office Visit Note  Patient Name: Jo Hampton  [id number]  [id number]  Date of Service: 03/27/2021  Chief Complaint  Patient presents with   Follow-up    Wants to review labwork   Breast Problem    Left breast has a small lump    Nail Problem    Nail fungus on both feet, using OTC medication to help   Menopause    Wants to discuss premenopause   Quality Metric Gaps    Colonoscopy, Tetanus and Pneumonia Vaccine    HPI Pt is here for routine follow up -reviewed labs which were normal -Admits she stopped taking synthroid about 1-2 months ago because she felt like it was too much medicine, but does notice that her weight started increasing despite working out twice per week. Discussed that free T4 is low normal and will restart on synthroid but at a lower dose and recheck labs in 6-8weeks. -Interested in metabolic test in future -Mom has had breast cancer twice and she reports having some left breast tenderness the past few months, sometimes feels tingling, no bumps or irregularities that she has noticed -She also has toenail fungus that she is treating topically. Discussed oral options with monitoring and she will continue topical for now. -Mentions her cycle has been more irregular lately and had an instance of hot flashes and thinks she may be perimenopausal. Discussed that irregularity will likely continue and that hot flashes may return. Patient will monitor symptoms.  Current Medication: Outpatient Encounter Medications as of 03/23/2021  Medication Sig   ergocalciferol (DRISDOL) 1.25 MG (50000 UT) capsule Take 1 capsule (50,000 Units total) by mouth once a week.   levothyroxine (SYNTHROID) 25 MCG tablet Take 1 tablet (25 mcg total) by mouth daily.   acyclovir (ZOVIRAX) 400 MG tablet Take 1 tablet (400 mg total) by mouth 2 (two) times daily. (Patient not taking: Reported on 03/23/2021)    albuterol (VENTOLIN HFA) 108 (90 Base) MCG/ACT inhaler Inhale 2 puffs into the lungs every 4 (four) hours as needed for wheezing or shortness of breath. (Patient not taking: Reported on 03/23/2021)   diclofenac sodium (VOLTAREN) 1 % GEL Apply 4 g topically 4 (four) times daily. (Patient not taking: Reported on 03/23/2021)   Hydrocortisone Acetate 2.5 % CREA Apply 1 application topically 2 (two) times daily as needed. (Patient not taking: Reported on 03/23/2021)   [DISCONTINUED] levothyroxine (SYNTHROID) 50 MCG tablet Take 1 tablet (50 mcg total) by mouth daily. On empty stomach (Patient not taking: Reported on 03/23/2021)   [DISCONTINUED] mupirocin ointment (BACTROBAN) 2 % Apply 1 application topically 2 (two) times daily. (Patient not taking: Reported on 03/23/2021)   No facility-administered encounter medications on file as of 03/23/2021.    Surgical History: Past Surgical History:  Procedure Laterality Date   ovarian  cyst removed      Medical History: History reviewed. No pertinent past medical history.  Family History: Family History  Problem Relation Age of Onset   Breast cancer Mother 46   Depression Mother     Social History   Socioeconomic History   Marital status: Single    Spouse name: Not on file   Number of children: Not on file   Years of education: Not on file   Highest education level: Not on file  Occupational History   Not on file  Tobacco Use   Smoking status: Never   Smokeless tobacco: Never  Vaping Use   Vaping Use: Never used  Substance and Sexual Activity   Alcohol use: Yes    Comment: drinks socially   Drug use: No   Sexual activity: Not on file  Other Topics Concern   Not on file  Social History Narrative   Not on file   Social Determinants of Health   Financial Resource Strain: Not on file  Food Insecurity: Not on file  Transportation Needs: Not on file  Physical Activity: Not on file  Stress: Not on file  Social Connections: Not on file   Intimate Partner Violence: Not on file      Review of Systems  Constitutional:  Negative for chills, fatigue and unexpected weight change.  HENT:  Negative for congestion, postnasal drip, rhinorrhea, sneezing and sore throat.   Eyes:  Negative for redness.  Respiratory:  Negative for cough, chest tightness and shortness of breath.   Cardiovascular:  Negative for chest pain and palpitations.  Gastrointestinal:  Negative for abdominal pain, constipation, diarrhea, nausea and vomiting.  Genitourinary:  Negative for dysuria and frequency.  Musculoskeletal:  Positive for arthralgias. Negative for back pain, joint swelling and neck pain.  Skin:  Negative for rash.       Breast tenderness on left  Neurological: Negative.  Negative for tremors and numbness.  Hematological:  Negative for adenopathy. Does not bruise/bleed easily.  Psychiatric/Behavioral:  Negative for behavioral problems (Depression), sleep disturbance and suicidal ideas. The patient is not nervous/anxious.    Vital Signs: BP 127/76    Pulse (!) 55    Temp 98 F (36.7 C)    Resp 16    Ht 5' 3"  (1.6 m)    Wt 190 lb 12.8 oz (86.5 kg)    SpO2 98%    BMI 33.80 kg/m    Physical Exam Vitals and nursing note reviewed.  Constitutional:      Appearance: Normal appearance. She is obese.  HENT:     Head: Normocephalic and atraumatic.     Nose: Nose normal.     Mouth/Throat:     Mouth: Mucous membranes are moist.     Pharynx: No posterior oropharyngeal erythema.  Eyes:     Extraocular Movements: Extraocular movements intact.     Pupils: Pupils are equal, round, and reactive to light.  Cardiovascular:     Pulses: Normal pulses.     Heart sounds: Normal heart sounds.  Pulmonary:     Effort: Pulmonary effort is normal.     Breath sounds: Normal breath sounds.  Chest:     Chest wall: No tenderness.  Breasts:    Right: Tenderness present.     Left: Normal. No tenderness.    Musculoskeletal:        General: Normal range of  motion.     Cervical back: Normal range of motion.  Neurological:     General: No focal deficit present.     Mental Status: She is alert.  Psychiatric:        Mood and Affect: Mood normal.        Behavior: Behavior normal.       Assessment/Plan: 1. Acquired hypothyroidism Will restart lower dose of synthroid and recheck labs in 6-8weeks - levothyroxine (SYNTHROID) 25 MCG tablet; Take 1 tablet (25 mcg total) by mouth daily.  Dispense: 90 tablet; Refill: 1 - TSH + free T4  2. Breast tenderness in female Will order mammogram and [place] especially given FHx - MM DIAG BREAST TOMO BILATERAL; Future -  US BREAST LTD UNI LEFT INC AXILLA; Future  3. Toenail fungus Will continue topical treatment for now  4. Obesity (BMI 30.0-34.9) Will consider metabolic test next visit and work on improving diet and exercise   General Counseling: Jo Hampton verbalizes understanding of the findings of todays visit and agrees with plan of treatment. I have discussed any further diagnostic evaluation that may be needed or ordered today. We also reviewed her medications today. she has been encouraged to call the office with any questions or concerns that should arise related to todays visit.    Orders Placed This Encounter  Procedures   MM DIAG BREAST TOMO BILATERAL   US BREAST LTD UNI LEFT INC AXILLA   TSH + free T4    Meds ordered this encounter  Medications   levothyroxine (SYNTHROID) 25 MCG tablet    Sig: Take 1 tablet (25 mcg total) by mouth daily.    Dispense:  90 tablet    Refill:  1    This patient was seen by Drema Dallas, PA-C in collaboration with Dr. Clayborn Bigness as a part of collaborative care agreement.   Total time spent:30 Minutes Time spent includes review of chart, medications, test results, and follow up plan with the patient.      Dr Lavera Guise Internal medicine

## 2021-03-26 ENCOUNTER — Telehealth: Payer: Self-pay

## 2021-03-26 NOTE — Telephone Encounter (Signed)
Mammogram & u/s scheduled @ [facility]. Patient notified of appointment date & time-Jo Hampton

## 2021-04-13 DIAGNOSIS — N644 Mastodynia: Secondary | ICD-10-CM | POA: Diagnosis not present

## 2021-04-13 DIAGNOSIS — R922 Inconclusive mammogram: Secondary | ICD-10-CM | POA: Diagnosis not present

## 2021-05-28 DIAGNOSIS — F4320 Adjustment disorder, unspecified: Secondary | ICD-10-CM | POA: Diagnosis not present

## 2021-06-07 DIAGNOSIS — F4320 Adjustment disorder, unspecified: Secondary | ICD-10-CM | POA: Diagnosis not present

## 2021-06-08 ENCOUNTER — Telehealth: Payer: Self-pay

## 2021-06-08 ENCOUNTER — Other Ambulatory Visit: Payer: Self-pay

## 2021-06-08 ENCOUNTER — Other Ambulatory Visit: Payer: Self-pay | Admitting: Nurse Practitioner

## 2021-06-08 DIAGNOSIS — E559 Vitamin D deficiency, unspecified: Secondary | ICD-10-CM

## 2021-06-08 MED ORDER — AMOXICILLIN-POT CLAVULANATE 875-125 MG PO TABS: 1.0000 | ORAL_TABLET | Freq: Two times a day (BID) | ORAL | 0 refills | Status: DC

## 2021-06-08 NOTE — Telephone Encounter (Signed)
Pt called that she was having sore throat ,sinus congestion,pain in left ear ,no fever advised her to do Covid test and as per DR Humphrey Rolls we send Augmentin for 7 days and try OTC Flonase and gargle with salt water  ?

## 2021-06-08 NOTE — Telephone Encounter (Signed)
Pt called back and gave her results for Covid Test and it was negative ?

## 2021-06-19 DIAGNOSIS — F4320 Adjustment disorder, unspecified: Secondary | ICD-10-CM | POA: Diagnosis not present

## 2021-06-20 DIAGNOSIS — Z01419 Encounter for gynecological examination (general) (routine) without abnormal findings: Secondary | ICD-10-CM | POA: Diagnosis not present

## 2021-06-20 DIAGNOSIS — Z6834 Body mass index (BMI) 34.0-34.9, adult: Secondary | ICD-10-CM | POA: Diagnosis not present

## 2021-06-22 ENCOUNTER — Telehealth: Payer: Self-pay

## 2021-06-22 ENCOUNTER — Encounter: Payer: Self-pay | Admitting: Physician Assistant

## 2021-06-22 ENCOUNTER — Ambulatory Visit: Payer: BC Managed Care – PPO | Admitting: Physician Assistant

## 2021-06-22 DIAGNOSIS — Z6832 Body mass index (BMI) 32.0-32.9, adult: Secondary | ICD-10-CM | POA: Diagnosis not present

## 2021-06-22 DIAGNOSIS — Z23 Encounter for immunization: Secondary | ICD-10-CM | POA: Diagnosis not present

## 2021-06-22 DIAGNOSIS — Z1212 Encounter for screening for malignant neoplasm of rectum: Secondary | ICD-10-CM

## 2021-06-22 DIAGNOSIS — E039 Hypothyroidism, unspecified: Secondary | ICD-10-CM | POA: Diagnosis not present

## 2021-06-22 DIAGNOSIS — Z1211 Encounter for screening for malignant neoplasm of colon: Secondary | ICD-10-CM | POA: Diagnosis not present

## 2021-06-22 MED ORDER — TETANUS-DIPHTH-ACELL PERTUSSIS 5-2.5-18.5 LF-MCG/0.5 IM SUSP: 0.5000 mL | Freq: Once | INTRAMUSCULAR | 0 refills | Status: AC

## 2021-06-22 NOTE — Progress Notes (Signed)
[facility] ?[address] ?[place], [place] [address] ? ?Internal MEDICINE  ?Office Visit Note ? ?Patient Name: Jo Hampton ? [id number]  ?[id number] ? ?Date of Service: 06/22/2021 ? ?Chief Complaint  ?Patient presents with  ? Follow-up  ? Weight Loss  ? ? ?HPI ?Pt is here for routine follow up ?--Has been going to the gym since last visit and working on exercise nad wt loss on her own. She is down 5lbs since last visit ?-Metabolic test shows goal caloric intake per day for weight loss at [id number]. Her metabolism is considered slower than normal. Discussed counting calories and exercise with myfitnesspal or alternative tracking app. Given 1200calorie meal plan guide ?-She has also been taking the low dose synthroid again after previously stopping it and is doing well with it. Will update labs ?-Is going back to OBGYN for IUD replacement soon ? ?Current Medication: ?Outpatient Encounter Medications as of 06/22/2021  ?Medication Sig  ? levothyroxine (SYNTHROID) 25 MCG tablet Take 1 tablet (25 mcg total) by mouth daily.  ? valACYclovir (VALTREX) 500 MG tablet Take 1 tablet by mouth daily.  ? Vitamin D, Ergocalciferol, (DRISDOL) 1.25 MG (50000 UNIT) CAPS capsule TAKE 1 CAPSULE BY MOUTH ONE TIME PER WEEK  ? [DISCONTINUED] Tdap (BOOSTRIX) 5-2.5-18.5 LF-MCG/0.5 injection Inject 0.5 mLs into the muscle once.  ? Tdap (BOOSTRIX) 5-2.5-18.5 LF-MCG/0.5 injection Inject 0.5 mLs into the muscle once for 1 dose.  ? [DISCONTINUED] acyclovir (ZOVIRAX) 400 MG tablet Take 1 tablet (400 mg total) by mouth 2 (two) times daily. (Patient not taking: Reported on 06/22/2021)  ? [DISCONTINUED] albuterol (VENTOLIN HFA) 108 (90 Base) MCG/ACT inhaler Inhale 2 puffs into the lungs every 4 (four) hours as needed for wheezing or shortness of breath. (Patient not taking: Reported on 03/23/2021)  ? [DISCONTINUED] amoxicillin-clavulanate (AUGMENTIN) 875-125 MG tablet Take 1 tablet by mouth 2 (two) times daily. (Patient not taking: Reported on  06/22/2021)  ? [DISCONTINUED] diclofenac sodium (VOLTAREN) 1 % GEL Apply 4 g topically 4 (four) times daily. (Patient not taking: Reported on 03/23/2021)  ? [DISCONTINUED] Hydrocortisone Acetate 2.5 % CREA Apply 1 application topically 2 (two) times daily as needed. (Patient not taking: Reported on 03/23/2021)  ? ?No facility-administered encounter medications on file as of 06/22/2021.  ? ? ?Surgical History: ?Past Surgical History:  ?Procedure Laterality Date  ? ovarian  cyst removed    ? ? ?Medical History: ?History reviewed. No pertinent past medical history. ? ?Family History: ?Family History  ?Problem Relation Age of Onset  ? Breast cancer Mother 84  ? Depression Mother   ? ? ?Social History  ? ?Socioeconomic History  ? Marital status: Single  ?  Spouse name: Not on file  ? Number of children: Not on file  ? Years of education: Not on file  ? Highest education level: Not on file  ?Occupational History  ? Not on file  ?Tobacco Use  ? Smoking status: Never  ? Smokeless tobacco: Never  ?Vaping Use  ? Vaping Use: Never used  ?Substance and Sexual Activity  ? Alcohol use: Yes  ?  Comment: drinks socially  ? Drug use: No  ? Sexual activity: Not on file  ?Other Topics Concern  ? Not on file  ?Social History Narrative  ? Not on file  ? ?Social Determinants of Health  ? ?Financial Resource Strain: Not on file  ?Food Insecurity: Not on file  ?Transportation Needs: Not on file  ?Physical Activity: Not on file  ?Stress: Not on file  ?  Social Connections: Not on file  ?Intimate Partner Violence: Not on file  ? ? ? ? ?Review of Systems  ?Constitutional:  Negative for chills, fatigue and unexpected weight change.  ?HENT:  Negative for congestion, postnasal drip, rhinorrhea, sneezing and sore throat.   ?Eyes:  Negative for redness.  ?Respiratory:  Negative for cough, chest tightness and shortness of breath.   ?Cardiovascular:  Negative for chest pain and palpitations.  ?Gastrointestinal:  Negative for abdominal pain, constipation,  diarrhea, nausea and vomiting.  ?Genitourinary:  Negative for dysuria and frequency.  ?Musculoskeletal:  Negative for arthralgias, back pain, joint swelling and neck pain.  ?Skin:  Negative for rash.  ?Neurological: Negative.  Negative for tremors and numbness.  ?Hematological:  Negative for adenopathy. Does not bruise/bleed easily.  ?Psychiatric/Behavioral:  Negative for behavioral problems (Depression), sleep disturbance and suicidal ideas. The patient is not nervous/anxious.   ? ?Vital Signs: ?BP 110/78   Pulse (!) 55   Temp 98.7 ?F (37.1 ?C)   Resp 16   Ht 5' 3"  (1.6 m)   Wt 185 lb 6.4 oz (84.1 kg)   SpO2 98%   BMI 32.84 kg/m?  ? ? ?Physical Exam ?Constitutional:   ?   General: She is not in acute distress. ?   Appearance: Normal appearance. She is obese. She is not ill-appearing.  ?HENT:  ?   Head: Normocephalic and atraumatic.  ?Eyes:  ?   Extraocular Movements: Extraocular movements intact.  ?   Pupils: Pupils are equal, round, and reactive to light.  ?Cardiovascular:  ?   Rate and Rhythm: Normal rate and regular rhythm.  ?Pulmonary:  ?   Effort: Pulmonary effort is normal. No respiratory distress.  ?Musculoskeletal:     ?   General: Normal range of motion.  ?Skin: ?   General: Skin is warm and dry.  ?Neurological:  ?   Mental Status: She is alert and oriented to person, place, and time.  ?   Cranial Nerves: No cranial nerve deficit.  ?   Coordination: Coordination normal.  ?   Gait: Gait normal.  ?Psychiatric:     ?   Mood and Affect: Mood normal.     ?   Behavior: Behavior normal.  ? ? ? ? ? ?Assessment/Plan: ?1. Acquired hypothyroidism ?We will continue low-dose Synthroid and will update labs ? ?2. Screening for colorectal cancer ?- Ambulatory referral to Gastroenterology ? ?3. Need for Tdap vaccination ?- Tdap (BOOSTRIX) 5-2.5-18.5 LF-MCG/0.5 injection; Inject 0.5 mLs into the muscle once for 1 dose.  Dispense: 0.5 mL; Refill: 0 ? ?4. BMI 32.0-32.9,adult ?- Metabolic Test done in office today which  does show slower than average metabolism.  Patient given meal planning guidance for 1200 cal/day meal plan as this is target number in order for her to lose weight.  She has already seen a 5 pound weight loss since last visit since initiating an exercise routine by going to the gym regularly.  She will continue to exercise regularly and will implement calorie counting for further weight loss goals.  Advised to use app such as my fitness pal to help her track calories ? ? ?General Counseling: Amariz verbalizes understanding of the findings of todays visit and agrees with plan of treatment. I have discussed any further diagnostic evaluation that may be needed or ordered today. We also reviewed her medications today. she has been encouraged to call the office with any questions or concerns that should arise related to todays visit. ? ? ? ?  Orders Placed This Encounter  ?Procedures  ? Metabolic Test  ? Ambulatory referral to Gastroenterology  ? ? ?Meds ordered this encounter  ?Medications  ? Tdap (BOOSTRIX) 5-2.5-18.5 LF-MCG/0.5 injection  ?  Sig: Inject 0.5 mLs into the muscle once for 1 dose.  ?  Dispense:  0.5 mL  ?  Refill:  0  ? ? ?This patient was seen by Drema Dallas, PA-C in collaboration with Dr. Clayborn Bigness as a part of collaborative care agreement. ? ? ?Total time spent:40 Minutes ?Time spent includes review of chart, medications, test results, and follow up plan with the patient.  ? ? ? ? ?Dr Lavera Guise ?Internal medicine  ?

## 2021-06-22 NOTE — Patient Instructions (Signed)
Obesity, Adult ?Obesity is having too much body fat. Being obese means that your weight is more than what is healthy for you.  ?BMI (body mass index) is a number that explains how much body fat you have. If you have a BMI of 30 or more, you are obese. ?Obesity can cause serious health problems, such as: ?Stroke. ?Coronary artery disease (CAD). ?Type 2 diabetes. ?Some types of cancer. ?High blood pressure (hypertension). ?High cholesterol. ?Gallbladder stones. ?Obesity can also contribute to: ?Osteoarthritis. ?Sleep apnea. ?Infertility problems. ?What are the causes? ?Eating meals each day that are high in calories, sugar, and fat. ?Drinking a lot of drinks that have sugar in them. ?Being born with genes that may make you more likely to become obese. ?Having a medical condition that causes obesity. ?Taking certain medicines. ?Sitting a lot (having a sedentary lifestyle). ?Not getting enough sleep. ?What increases the risk? ?Having a family history of obesity. ?Living in an area with limited access to: ?[place], recreation centers, or sidewalks. ?Healthy food choices, such as grocery stores and farmers' markets. ?What are the signs or symptoms? ?The main sign is having too much body fat. ?How is this treated? ?Treatment for this condition often includes changing your lifestyle. Treatment may include: ?Changing your diet. This may include making a healthy meal plan. ?Exercise. This may include activity that causes your heart to beat faster (aerobic exercise) and strength training. Work with your doctor to design a program that works for you. ?Medicine to help you lose weight. This may be used if you are not able to lose one pound a week after 6 weeks of healthy eating and more exercise. ?Treating conditions that cause the obesity. ?Surgery. Options may include gastric banding and gastric bypass. This may be done if: ?Other treatments have not helped to improve your condition. ?You have a BMI of 40 or higher. ?You have  life-threatening health problems related to obesity. ?Follow these instructions at home: ?Eating and drinking ? ?Follow advice from your doctor about what to eat and drink. Your doctor may tell you to: ?Limit fast food, sweets, and processed snack foods. ?Choose low-fat options. For example, choose low-fat milk instead of whole milk. ?Eat five or more servings of fruits or vegetables each day. ?Eat at home more often. This gives you more control over what you eat. ?Choose healthy foods when you eat out. ?Learn to read food labels. This will help you learn how much food is in one serving. ?Keep low-fat snacks available. ?Avoid drinks that have a lot of sugar in them. These include soda, fruit juice, iced tea with sugar, and flavored milk. ?Drink enough water to keep your pee (urine) pale yellow. ?Do not go on fad diets. ?Physical activity ?Exercise often, as told by your doctor. Most adults should get up to 150 minutes of moderate-intensity exercise every week.Ask your doctor: ?What types of exercise are safe for you. ?How often you should exercise. ?Warm up and stretch before being active. ?Do slow stretching after being active (cool down). ?Rest between times of being active. ?Lifestyle ?Work with your doctor and a Publishing rights manager (dietitian) to set a weight-loss goal that is best for you. ?Limit your screen time. ?Find ways to reward yourself that do not involve food. ?Do not drink alcohol if: ?Your doctor tells you not to drink. ?You are pregnant, may be pregnant, or are planning to become pregnant. ?If you drink alcohol: ?Limit how much you have to: ?0-1 drink a day for women. ?0-2 drinks  a day for men. ?Know how much alcohol is in your drink. In the [place] one drink equals one 12 oz bottle of beer (355 mL), one 5 oz glass of wine (148 mL), or one 1? oz glass of hard liquor (44 mL). ?General instructions ?Keep a weight-loss journal. This can help you keep track of: ?The food that you eat. ?How much exercise you  get. ?Take over-the-counter and prescription medicines only as told by your doctor. ?Take vitamins and supplements only as told by your doctor. ?Think about joining a support group. ?Pay attention to your mental health as obesity can lead to depression or self esteem issues. ?Keep all follow-up visits. ?Contact a doctor if: ?You cannot meet your weight-loss goal after you have changed your diet and lifestyle for 6 weeks. ?You are having trouble breathing. ?Summary ?Obesity is having too much body fat. ?Being obese means that your weight is more than what is healthy for you. ?Work with your doctor to set a weight-loss goal. ?Get regular exercise as told by your doctor. ?This information is not intended to replace advice given to you by your health care provider. Make sure you discuss any questions you have with your health care provider. ?Document Revised: 10/03/2020 Document Reviewed: 10/03/2020 ?Elsevier Patient Education ? [place]. ? ?

## 2021-06-22 NOTE — Telephone Encounter (Signed)
GI referral manually faxed to [facility] per patient's request. [phone] ?

## 2021-06-25 ENCOUNTER — Encounter: Payer: Self-pay | Admitting: Internal Medicine

## 2021-06-25 ENCOUNTER — Encounter: Payer: Self-pay | Admitting: Physician Assistant

## 2021-06-27 DIAGNOSIS — F4320 Adjustment disorder, unspecified: Secondary | ICD-10-CM | POA: Diagnosis not present

## 2021-07-06 DIAGNOSIS — R058 Other specified cough: Secondary | ICD-10-CM | POA: Diagnosis not present

## 2021-07-17 DIAGNOSIS — F4320 Adjustment disorder, unspecified: Secondary | ICD-10-CM | POA: Diagnosis not present

## 2021-07-20 ENCOUNTER — Telehealth: Payer: Self-pay

## 2021-07-23 ENCOUNTER — Other Ambulatory Visit: Payer: Self-pay

## 2021-07-23 DIAGNOSIS — Z1211 Encounter for screening for malignant neoplasm of colon: Secondary | ICD-10-CM

## 2021-07-23 NOTE — Telephone Encounter (Signed)
Send cologuard she don't have Fhx colon cancer  ?

## 2021-08-01 DIAGNOSIS — F4320 Adjustment disorder, unspecified: Secondary | ICD-10-CM | POA: Diagnosis not present

## 2021-08-09 DIAGNOSIS — Z1211 Encounter for screening for malignant neoplasm of colon: Secondary | ICD-10-CM | POA: Diagnosis not present

## 2021-08-15 DIAGNOSIS — F4320 Adjustment disorder, unspecified: Secondary | ICD-10-CM | POA: Diagnosis not present

## 2021-08-16 LAB — COLOGUARD: COLOGUARD: NEGATIVE

## 2021-08-20 ENCOUNTER — Ambulatory Visit: Payer: BC Managed Care – PPO | Admitting: Physician Assistant

## 2021-08-24 ENCOUNTER — Ambulatory Visit: Payer: BC Managed Care – PPO | Admitting: Physician Assistant

## 2021-08-29 DIAGNOSIS — F4320 Adjustment disorder, unspecified: Secondary | ICD-10-CM | POA: Diagnosis not present

## 2021-09-07 DIAGNOSIS — E039 Hypothyroidism, unspecified: Secondary | ICD-10-CM | POA: Diagnosis not present

## 2021-09-12 DIAGNOSIS — F4320 Adjustment disorder, unspecified: Secondary | ICD-10-CM | POA: Diagnosis not present

## 2021-09-16 ENCOUNTER — Other Ambulatory Visit: Payer: Self-pay | Admitting: Physician Assistant

## 2021-09-16 DIAGNOSIS — E039 Hypothyroidism, unspecified: Secondary | ICD-10-CM

## 2021-09-17 ENCOUNTER — Ambulatory Visit: Payer: BC Managed Care – PPO | Admitting: Physician Assistant

## 2021-09-17 ENCOUNTER — Telehealth: Payer: Self-pay

## 2021-09-17 ENCOUNTER — Encounter: Payer: Self-pay | Admitting: Physician Assistant

## 2021-09-17 VITALS — BP 115/84 | HR 64 | Temp 97.6°F | Resp 16 | Ht 63.0 in | Wt 187.4 lb

## 2021-09-17 DIAGNOSIS — E669 Obesity, unspecified: Secondary | ICD-10-CM

## 2021-09-17 DIAGNOSIS — E039 Hypothyroidism, unspecified: Secondary | ICD-10-CM | POA: Diagnosis not present

## 2021-09-17 NOTE — Telephone Encounter (Signed)
Pt advised that thyroid test is normal continue same med as prescribed

## 2021-09-17 NOTE — Progress Notes (Signed)
[facility] [address] [place], [place] [address]  Internal MEDICINE  Office Visit Note  Patient Name: Jo Hampton  [id number]  [id number]  Date of Service: 09/25/2021  Chief Complaint  Patient presents with   Follow-up    Discussing lab results and weight    HPI Pt is here for routine follow up -Has been cycling more and did some races -Is working on creating a consistent work out schedule and advised against starting too strenuous and losing motivation. Discussed better to be consistent and increase as able/comfortable. -she is also working on diet and avoiding any foods that she finds problematic and limiting fried food/eating out. -She does not like tracking calories as this takes a lot of time and is trying to be more mindful with her food choices rather than tracking with app. Her metabolic test last visit did show wt loss zone of 1200-1400 calories per day. -Did discuss if interested in medications to aid wt loss in future that this can be discussed. -Thyroid results initially not available during appt, however they were obtained and pt notified over the phone that Tsh was normal and may continue synthroid as before.  Current Medication: Outpatient Encounter Medications as of 09/17/2021  Medication Sig   levothyroxine (SYNTHROID) 25 MCG tablet TAKE 1 TABLET BY MOUTH EVERY DAY   valACYclovir (VALTREX) 500 MG tablet Take 1 tablet by mouth daily.   Vitamin D, Ergocalciferol, (DRISDOL) 1.25 MG (50000 UNIT) CAPS capsule TAKE 1 CAPSULE BY MOUTH ONE TIME PER WEEK   No facility-administered encounter medications on file as of 09/17/2021.    Surgical History: Past Surgical History:  Procedure Laterality Date   ovarian  cyst removed      Medical History: History reviewed. No pertinent past medical history.  Family History: Family History  Problem Relation Age of Onset   Breast cancer Mother 16   Depression Mother     Social History   Socioeconomic History    Marital status: Single    Spouse name: Not on file   Number of children: Not on file   Years of education: Not on file   Highest education level: Not on file  Occupational History   Not on file  Tobacco Use   Smoking status: Never   Smokeless tobacco: Never  Vaping Use   Vaping Use: Never used  Substance and Sexual Activity   Alcohol use: Yes    Comment: drinks socially   Drug use: No   Sexual activity: Not on file  Other Topics Concern   Not on file  Social History Narrative   Not on file   Social Determinants of Health   Financial Resource Strain: Not on file  Food Insecurity: Not on file  Transportation Needs: Not on file  Physical Activity: Not on file  Stress: Not on file  Social Connections: Not on file  Intimate Partner Violence: Not on file      Review of Systems  Constitutional:  Negative for chills, fatigue and unexpected weight change.  HENT:  Negative for congestion, postnasal drip, rhinorrhea, sneezing and sore throat.   Eyes:  Negative for redness.  Respiratory:  Negative for cough, chest tightness and shortness of breath.   Cardiovascular:  Negative for chest pain and palpitations.  Gastrointestinal:  Negative for abdominal pain, constipation, diarrhea, nausea and vomiting.  Genitourinary:  Negative for dysuria and frequency.  Musculoskeletal:  Negative for arthralgias, back pain, joint swelling and neck pain.  Skin:  Negative for rash.  Neurological: Negative.  Negative for tremors and numbness.  Hematological:  Negative for adenopathy. Does not bruise/bleed easily.  Psychiatric/Behavioral:  Negative for behavioral problems (Depression), sleep disturbance and suicidal ideas. The patient is not nervous/anxious.     Vital Signs: BP 115/84   Pulse 64   Temp 97.6 F (36.4 C)   Resp 16   Ht 5\' 3"  (1.6 m)   Wt 187 lb 6.4 oz (85 kg)   SpO2 98%   BMI 33.20 kg/m    Physical Exam Constitutional:      General: She is not in acute distress.     Appearance: Normal appearance. She is obese. She is not ill-appearing.  HENT:     Head: Normocephalic and atraumatic.  Eyes:     Extraocular Movements: Extraocular movements intact.     Pupils: Pupils are equal, round, and reactive to light.  Cardiovascular:     Rate and Rhythm: Normal rate and regular rhythm.  Pulmonary:     Effort: Pulmonary effort is normal. No respiratory distress.  Abdominal:     Tenderness: There is no abdominal tenderness.  Musculoskeletal:        General: Normal range of motion.  Skin:    General: Skin is warm and dry.  Neurological:     Mental Status: She is alert and oriented to person, place, and time.     Cranial Nerves: No cranial nerve deficit.     Coordination: Coordination normal.     Gait: Gait normal.  Psychiatric:        Mood and Affect: Mood normal.        Behavior: Behavior normal.        Assessment/Plan: 1. Acquired hypothyroidism TSH stable, may continue synthroid as before  2. Obesity (BMI 30.0-34.9) Will work on consistent exercise routine and increasing as able while also working on improving diet and restricting calories. Obesity Counseling: Risk Assessment: An assessment of behavioral risk factors was made today and includes lack of exercise sedentary lifestyle, lack of portion control and poor dietary habits.  Risk Modification Advice: She was counseled on portion control guidelines. Restricting daily caloric intake to 1200-1400. The detrimental long term effects of obesity on her health and ongoing poor compliance was also discussed with the patient.      General Counseling: [place] verbalizes understanding of the findings of todays visit and agrees with plan of treatment. I have discussed any further diagnostic evaluation that may be needed or ordered today. We also reviewed her medications today. she has been encouraged to call the office with any questions or concerns that should arise related to todays visit.    No  orders of the defined types were placed in this encounter.   No orders of the defined types were placed in this encounter.   This patient was seen by 07-13-1992, PA-C in collaboration with Dr. Lynn Ito as a part of collaborative care agreement.   Total time spent:30 Minutes Time spent includes review of chart, medications, test results, and follow up plan with the patient.      Dr Beverely Risen Internal medicine

## 2021-09-25 NOTE — Patient Instructions (Signed)
Obesity, Adult ?Obesity is having too much body fat. Being obese means that your weight is more than what is healthy for you.  ?BMI (body mass index) is a number that explains how much body fat you have. If you have a BMI of 30 or more, you are obese. ?Obesity can cause serious health problems, such as: ?Stroke. ?Coronary artery disease (CAD). ?Type 2 diabetes. ?Some types of cancer. ?High blood pressure (hypertension). ?High cholesterol. ?Gallbladder stones. ?Obesity can also contribute to: ?Osteoarthritis. ?Sleep apnea. ?Infertility problems. ?What are the causes? ?Eating meals each day that are high in calories, sugar, and fat. ?Drinking a lot of drinks that have sugar in them. ?Being born with genes that may make you more likely to become obese. ?Having a medical condition that causes obesity. ?Taking certain medicines. ?Sitting a lot (having a sedentary lifestyle). ?Not getting enough sleep. ?What increases the risk? ?Having a family history of obesity. ?Living in an area with limited access to: ?Parks, recreation centers, or sidewalks. ?Healthy food choices, such as grocery stores and farmers' markets. ?What are the signs or symptoms? ?The main sign is having too much body fat. ?How is this treated? ?Treatment for this condition often includes changing your lifestyle. Treatment may include: ?Changing your diet. This may include making a healthy meal plan. ?Exercise. This may include activity that causes your heart to beat faster (aerobic exercise) and strength training. Work with your doctor to design a program that works for you. ?Medicine to help you lose weight. This may be used if you are not able to lose one pound a week after 6 weeks of healthy eating and more exercise. ?Treating conditions that cause the obesity. ?Surgery. Options may include gastric banding and gastric bypass. This may be done if: ?Other treatments have not helped to improve your condition. ?You have a BMI of 40 or higher. ?You have  life-threatening health problems related to obesity. ?Follow these instructions at home: ?Eating and drinking ? ?Follow advice from your doctor about what to eat and drink. Your doctor may tell you to: ?Limit fast food, sweets, and processed snack foods. ?Choose low-fat options. For example, choose low-fat milk instead of whole milk. ?Eat five or more servings of fruits or vegetables each day. ?Eat at home more often. This gives you more control over what you eat. ?Choose healthy foods when you eat out. ?Learn to read food labels. This will help you learn how much food is in one serving. ?Keep low-fat snacks available. ?Avoid drinks that have a lot of sugar in them. These include soda, fruit juice, iced tea with sugar, and flavored milk. ?Drink enough water to keep your pee (urine) pale yellow. ?Do not go on fad diets. ?Physical activity ?Exercise often, as told by your doctor. Most adults should get up to 150 minutes of moderate-intensity exercise every week.Ask your doctor: ?What types of exercise are safe for you. ?How often you should exercise. ?Warm up and stretch before being active. ?Do slow stretching after being active (cool down). ?Rest between times of being active. ?Lifestyle ?Work with your doctor and a food expert (dietitian) to set a weight-loss goal that is best for you. ?Limit your screen time. ?Find ways to reward yourself that do not involve food. ?Do not drink alcohol if: ?Your doctor tells you not to drink. ?You are pregnant, may be pregnant, or are planning to become pregnant. ?If you drink alcohol: ?Limit how much you have to: ?0-1 drink a day for women. ?0-2 drinks   a day for men. ?Know how much alcohol is in your drink. In the [place] one drink equals one 12 oz bottle of beer (355 mL), one 5 oz glass of wine (148 mL), or one 1? oz glass of hard liquor (44 mL). ?General instructions ?Keep a weight-loss journal. This can help you keep track of: ?The food that you eat. ?How much exercise you  get. ?Take over-the-counter and prescription medicines only as told by your doctor. ?Take vitamins and supplements only as told by your doctor. ?Think about joining a support group. ?Pay attention to your mental health as obesity can lead to depression or self esteem issues. ?Keep all follow-up visits. ?Contact a doctor if: ?You cannot meet your weight-loss goal after you have changed your diet and lifestyle for 6 weeks. ?You are having trouble breathing. ?Summary ?Obesity is having too much body fat. ?Being obese means that your weight is more than what is healthy for you. ?Work with your doctor to set a weight-loss goal. ?Get regular exercise as told by your doctor. ?This information is not intended to replace advice given to you by your health care provider. Make sure you discuss any questions you have with your health care provider. ?Document Revised: 10/03/2020 Document Reviewed: 10/03/2020 ?Elsevier Patient Education ? 2023 [employer] ? ?

## 2021-10-02 DIAGNOSIS — F4320 Adjustment disorder, unspecified: Secondary | ICD-10-CM | POA: Diagnosis not present

## 2021-10-15 DIAGNOSIS — Z23 Encounter for immunization: Secondary | ICD-10-CM | POA: Diagnosis not present

## 2021-10-16 ENCOUNTER — Other Ambulatory Visit: Payer: Self-pay

## 2021-10-16 DIAGNOSIS — F4320 Adjustment disorder, unspecified: Secondary | ICD-10-CM | POA: Diagnosis not present

## 2021-10-16 MED ORDER — HYDROCORTISONE (PERIANAL) 2.5 % EX CREA: 1.0000 | TOPICAL_CREAM | Freq: Two times a day (BID) | CUTANEOUS | 0 refills | Status: DC

## 2021-10-16 NOTE — Telephone Encounter (Signed)
Pt called that had skin irritation at rectal area as per dr Welton Flakes send PROCTOSOL [facility]  to her pharmacy

## 2021-10-23 DIAGNOSIS — F4320 Adjustment disorder, unspecified: Secondary | ICD-10-CM | POA: Diagnosis not present

## 2021-11-28 ENCOUNTER — Other Ambulatory Visit: Payer: Self-pay | Admitting: Nurse Practitioner

## 2021-11-28 DIAGNOSIS — E559 Vitamin D deficiency, unspecified: Secondary | ICD-10-CM

## 2021-12-04 DIAGNOSIS — F4320 Adjustment disorder, unspecified: Secondary | ICD-10-CM | POA: Diagnosis not present

## 2021-12-18 DIAGNOSIS — F4320 Adjustment disorder, unspecified: Secondary | ICD-10-CM | POA: Diagnosis not present

## 2022-01-01 DIAGNOSIS — F4320 Adjustment disorder, unspecified: Secondary | ICD-10-CM | POA: Diagnosis not present

## 2022-01-15 DIAGNOSIS — F4320 Adjustment disorder, unspecified: Secondary | ICD-10-CM | POA: Diagnosis not present

## 2022-01-29 DIAGNOSIS — F4320 Adjustment disorder, unspecified: Secondary | ICD-10-CM | POA: Diagnosis not present

## 2022-02-08 ENCOUNTER — Telehealth: Payer: Self-pay | Admitting: Physician Assistant

## 2022-02-08 NOTE — Telephone Encounter (Signed)
Left vm and sent mychart message to confirm 02/15/22 appointment-Toni

## 2022-02-12 DIAGNOSIS — F4320 Adjustment disorder, unspecified: Secondary | ICD-10-CM | POA: Diagnosis not present

## 2022-02-15 ENCOUNTER — Encounter: Payer: Self-pay | Admitting: Physician Assistant

## 2022-02-15 ENCOUNTER — Ambulatory Visit (INDEPENDENT_AMBULATORY_CARE_PROVIDER_SITE_OTHER): Payer: BC Managed Care – PPO | Admitting: Physician Assistant

## 2022-02-15 VITALS — BP 111/75 | HR 63 | Temp 98.4°F | Resp 16 | Ht 63.0 in | Wt 185.4 lb

## 2022-02-15 DIAGNOSIS — G44019 Episodic cluster headache, not intractable: Secondary | ICD-10-CM | POA: Diagnosis not present

## 2022-02-15 DIAGNOSIS — N951 Menopausal and female climacteric states: Secondary | ICD-10-CM

## 2022-02-15 DIAGNOSIS — R5383 Other fatigue: Secondary | ICD-10-CM

## 2022-02-15 DIAGNOSIS — Z833 Family history of diabetes mellitus: Secondary | ICD-10-CM

## 2022-02-15 DIAGNOSIS — M064 Inflammatory polyarthropathy: Secondary | ICD-10-CM

## 2022-02-15 DIAGNOSIS — E782 Mixed hyperlipidemia: Secondary | ICD-10-CM | POA: Diagnosis not present

## 2022-02-15 DIAGNOSIS — E039 Hypothyroidism, unspecified: Secondary | ICD-10-CM

## 2022-02-15 DIAGNOSIS — R300 Dysuria: Secondary | ICD-10-CM

## 2022-02-15 DIAGNOSIS — Z0001 Encounter for general adult medical examination with abnormal findings: Secondary | ICD-10-CM

## 2022-02-15 DIAGNOSIS — E559 Vitamin D deficiency, unspecified: Secondary | ICD-10-CM

## 2022-02-15 MED ORDER — VITAMIN D (ERGOCALCIFEROL) 1.25 MG (50000 UNIT) PO CAPS: ORAL_CAPSULE | ORAL | 1 refills | Status: DC

## 2022-02-15 MED ORDER — VENLAFAXINE HCL ER 37.5 MG PO CP24: 37.5000 mg | ORAL_CAPSULE | Freq: Every day | ORAL | 1 refills | Status: DC

## 2022-02-15 MED ORDER — TOPIRAMATE 25 MG PO TABS: 25.0000 mg | ORAL_TABLET | Freq: Every day | ORAL | 2 refills | Status: DC

## 2022-02-15 NOTE — Progress Notes (Signed)
[facility] [place], [place] [address]  Internal MEDICINE  Office Visit Note  Patient Name: Jo Hampton  [id number]  [id number]  Date of Service: 02/23/2022  Chief Complaint  Patient presents with   Annual Exam     HPI Pt is here for routine health maintenance examination and several concerns today -Thinks she may be perimenopausal. Last year had 1 or 2 missed cycles, but now hasn't had a cycle since Sept. -Hot flashes and night sweats impacting her sleep. -On Monday had a headache come on during climax, took some tylenol and checked BP after and it was good. Some dull headache. Had an instance at the gym when she would have a rush and bad headache come over. -A long time ago had an anal fissure and was prescribed some creams for this. Recently when more constipated had some blood on tissue again and is worried it is back or aggravated hemorrhoids. Was sent proctosol -Sees gynecologist, reports mammogram done this year and outside facility and will send records -Cologaurd done earlier this year -will check labs  Current Medication: Outpatient Encounter Medications as of 02/15/2022  Medication Sig   cyanocobalamin (VITAMIN B12) 1000 MCG tablet Take 3,000 mcg by mouth daily.   hydrocortisone (PROCTOSOL HC) 2.5 % rectal cream Apply 1 Application topically 2 (two) times daily.   levothyroxine (SYNTHROID) 25 MCG tablet TAKE 1 TABLET BY MOUTH EVERY DAY   topiramate (TOPAMAX) 25 MG tablet Take 1 tablet (25 mg total) by mouth daily.   valACYclovir (VALTREX) 500 MG tablet Take 1 tablet by mouth daily.   venlafaxine XR (EFFEXOR XR) 37.5 MG 24 hr capsule Take 1 capsule (37.5 mg total) by mouth daily.   Zinc 30 MG TABS Take 35 mg by mouth daily. Take 55m daily   [DISCONTINUED] Vitamin D, Ergocalciferol, (DRISDOL) 1.25 MG (50000 UNIT) CAPS capsule TAKE 1 CAPSULE BY MOUTH ONE TIME PER WEEK   Vitamin D, Ergocalciferol, (DRISDOL) 1.25 MG (50000 UNIT) CAPS capsule TAKE  1 CAPSULE BY MOUTH ONE TIME PER WEEK   No facility-administered encounter medications on file as of 02/15/2022.    Surgical History: Past Surgical History:  Procedure Laterality Date   ovarian  cyst removed      Medical History: History reviewed. No pertinent past medical history.  Family History: Family History  Problem Relation Age of Onset   Breast cancer Mother 467  Depression Mother       Review of Systems  Constitutional:  Negative for chills, fatigue and unexpected weight change.  HENT:  Negative for congestion, postnasal drip, rhinorrhea, sneezing and sore throat.   Eyes:  Negative for redness.  Respiratory:  Negative for cough, chest tightness and shortness of breath.   Cardiovascular:  Negative for chest pain and palpitations.  Gastrointestinal:  Positive for constipation. Negative for abdominal pain, diarrhea, nausea and vomiting.  Genitourinary:  Positive for menstrual problem. Negative for dysuria and frequency.  Musculoskeletal:  Negative for arthralgias, back pain, joint swelling and neck pain.  Skin:  Negative for rash.  Neurological: Negative.  Negative for tremors and numbness.  Hematological:  Negative for adenopathy. Does not bruise/bleed easily.  Psychiatric/Behavioral:  Positive for sleep disturbance. Negative for behavioral problems (Depression) and suicidal ideas. The patient is not nervous/anxious.      Vital Signs: BP 111/75   Pulse 63   Temp 98.4 F (36.9 C)   Resp 16   Ht _0  (1.6 m)   Wt 185 lb 6.4 oz (  84.1 kg)   SpO2 99%   BMI 32.84 kg/m    Physical Exam Constitutional:      General: She is not in acute distress.    Appearance: Normal appearance. She is obese. She is not ill-appearing.  HENT:     Head: Normocephalic and atraumatic.  Eyes:     Extraocular Movements: Extraocular movements intact.     Pupils: Pupils are equal, round, and reactive to light.  Cardiovascular:     Rate and Rhythm: Normal rate and regular rhythm.   Pulmonary:     Effort: Pulmonary effort is normal. No respiratory distress.  Abdominal:     Tenderness: There is no abdominal tenderness.  Musculoskeletal:        General: Normal range of motion.  Skin:    General: Skin is warm and dry.  Neurological:     Mental Status: She is alert and oriented to person, place, and time.     Cranial Nerves: No cranial nerve deficit.     Coordination: Coordination normal.     Gait: Gait normal.  Psychiatric:        Mood and Affect: Mood normal.        Behavior: Behavior normal.      LABS: Recent Results (from the past 2160 hour(s))  Lipid Panel With LDL/HDL Ratio     Status: Abnormal   Collection Time: 02/15/22  1:16 PM  Result Value Ref Range   Cholesterol, Total 227 (H) 100 - 199 mg/dL   Triglycerides 183 (H) 0 - 149 mg/dL   HDL 44 >39 mg/dL   VLDL Cholesterol Cal 33 5 - 40 mg/dL   LDL Chol Calc (NIH) 150 (H) 0 - 99 mg/dL   LDL/HDL Ratio 3.4 (H) 0.0 - 3.2 ratio    Comment:                                     LDL/HDL Ratio                                             Men  Women                               1/2 Avg.Risk  1.0    1.5                                   Avg.Risk  3.6    3.2                                2X Avg.Risk  6.2    5.0                                3X Avg.Risk  8.0    6.1   TSH + free T4     Status: None   Collection Time: 02/15/22  1:16 PM  Result Value Ref Range   TSH 2.390 0.450 - 4.500 uIU/mL   Free T4 1.14 0.82 - 1.77 ng/dL  CBC w/Diff/Platelet     Status: None  Collection Time: 02/15/22  1:16 PM  Result Value Ref Range   WBC 6.3 3.4 - 10.8 x10E3/uL   RBC 4.77 3.77 - 5.28 x10E6/uL   Hemoglobin 14.6 11.1 - 15.9 g/dL   Hematocrit 43.4 34.0 - 46.6 %   MCV 91 79 - 97 fL   MCH 30.6 26.6 - 33.0 pg   MCHC 33.6 31.5 - 35.7 g/dL   RDW 12.7 11.7 - 15.4 %   Platelets 324 150 - 450 x10E3/uL   Neutrophils 59 Not Estab. %   Lymphs 31 Not Estab. %   Monocytes 7 Not Estab. %   Eos 2 Not Estab. %   Basos 1 Not  Estab. %   Neutrophils Absolute 3.8 1.4 - 7.0 x10E3/uL   Lymphocytes Absolute 1.9 0.7 - 3.1 x10E3/uL   Monocytes Absolute 0.5 0.1 - 0.9 x10E3/uL   EOS (ABSOLUTE) 0.1 0.0 - 0.4 x10E3/uL   Basophils Absolute 0.1 0.0 - 0.2 x10E3/uL   Immature Granulocytes 0 Not Estab. %   Immature Grans (Abs) 0.0 0.0 - 0.1 x10E3/uL  Comprehensive metabolic panel     Status: Abnormal   Collection Time: 02/15/22  1:16 PM  Result Value Ref Range   Glucose 101 (H) 70 - 99 mg/dL   BUN 12 6 - 24 mg/dL   Creatinine, Ser 0.90 0.57 - 1.00 mg/dL   eGFR 80 >59 mL/min/1.73   BUN/Creatinine Ratio 13 9 - 23   Sodium 138 134 - 144 mmol/L   Potassium 4.6 3.5 - 5.2 mmol/L   Chloride 101 96 - 106 mmol/L   CO2 25 20 - 29 mmol/L   Calcium 9.6 8.7 - 10.2 mg/dL   Total Protein 6.7 6.0 - 8.5 g/dL   Albumin 4.2 3.9 - 4.9 g/dL   Globulin, Total 2.5 1.5 - 4.5 g/dL   Albumin/Globulin Ratio 1.7 1.2 - 2.2   Bilirubin Total 0.3 0.0 - 1.2 mg/dL   Alkaline Phosphatase 80 44 - 121 IU/L   AST 32 0 - 40 IU/L   ALT 47 (H) 0 - 32 IU/L  FSH/LH     Status: None   Collection Time: 02/15/22  1:16 PM  Result Value Ref Range   LH 44.4 mIU/mL    Comment:                      Adult Female              Range                       Follicular phase      2.4 -  12.6                       Ovulation phase      14.0 -  95.6                       Luteal phase          1.0 -  11.4                       Postmenopausal        7.7 -  58.5    FSH 62.6 mIU/mL    Comment:                      Adult Female  Range                       Follicular phase      3.5 -  12.5                       Ovulation phase       4.7 -  21.5                       Luteal phase          1.7 -   7.7                       Postmenopausal       25.8 - 134.8   Estradiol     Status: None   Collection Time: 02/15/22  1:16 PM  Result Value Ref Range   Estradiol 9.8 pg/mL    Comment:                      Adult Female             Range                        Follicular phase     10.2 - 166.0                       Ovulation phase      85.8 - 498.0                       Luteal phase         43.8 - 211.0                       Postmenopausal       <6.0 -  54.7                      Pregnancy                       1st trimester     215.0 - >4300.0 Roche ECLIA methodology   Sed Rate (ESR)     Status: None   Collection Time: 02/15/22  1:16 PM  Result Value Ref Range   Sed Rate 13 0 - 32 mm/hr  Hgb A1C w/o eAG     Status: Abnormal   Collection Time: 02/15/22  1:16 PM  Result Value Ref Range   Hgb A1c MFr Bld 6.0 (H) 4.8 - 5.6 %    Comment:          Prediabetes: 5.7 - 6.4          Diabetes: >6.4          Glycemic control for adults with diabetes: <7.0   UA/M w/rflx Culture, Routine     Status: Abnormal   Collection Time: 02/15/22  1:40 PM   Specimen: Urine   Urine  Result Value Ref Range   Specific Gravity, UA 1.022 1.005 - 1.030   pH, UA 8.0 (H) 5.0 - 7.5   Color, UA Yellow Yellow   Appearance Ur Cloudy (A) Clear   Leukocytes,UA 2+ (A) Negative   Protein,UA Negative Negative/Trace   Glucose, UA Negative Negative   Ketones, UA Negative Negative   RBC, UA Negative Negative  Bilirubin, UA Negative Negative   Urobilinogen, Ur 0.2 0.2 - 1.0 mg/dL   Nitrite, UA Negative Negative   Microscopic Examination See below:     Comment: Microscopic was indicated and was performed.   Urinalysis Reflex Comment     Comment: This specimen has reflexed to a Urine Culture.  Microscopic Examination     Status: Abnormal   Collection Time: 02/15/22  1:40 PM   Urine  Result Value Ref Range   WBC, UA 0-5 0 - 5 /hpf   RBC, Urine 0-2 0 - 2 /hpf   Epithelial Cells (non renal) >10 (A) 0 - 10 /hpf   Casts None seen None seen /lpf   Bacteria, UA Few None seen/Few  Urine Culture, Reflex     Status: Abnormal   Collection Time: 02/15/22  1:40 PM   Urine  Result Value Ref Range   Urine Culture, Routine Final report (A)    Organism ID, Bacteria Enterococcus  faecalis (A)     Comment: For Enterococcus species, aminoglycosides (except for high-level resistance screening), cephalosporins, clindamycin, and trimethoprim-sulfamethoxazole are not effective clinically. (CLSI, M100-S26, 2016) 50,000-100,000 colony forming units per mL    Antimicrobial Susceptibility Comment     Comment:       ** S = Susceptible; I = Intermediate; R = Resistant **                    P = Positive; N = Negative             MICS are expressed in micrograms per mL    Antibiotic                 RSLT#1    RSLT#2    RSLT#3    RSLT#4 Ciprofloxacin                  S Levofloxacin                   S Nitrofurantoin                 S Penicillin                     S Tetracycline                   R Vancomycin                     S         Assessment/Plan: 1. Encounter for general adult medical examination with abnormal findings CPE performed, UTD on mammogram and colon screening  2. Menopausal vasomotor syndrome Will start effexor to help with night sweats and check labs - FSH/LH - Estradiol - venlafaxine XR (EFFEXOR XR) 37.5 MG 24 hr capsule; Take 1 capsule (37.5 mg total) by mouth daily.  Dispense: 30 capsule; Refill: 1  3. Episodic cluster headache, not intractable May start topamax to help headaches and wt loss - topiramate (TOPAMAX) 25 MG tablet; Take 1 tablet (25 mg total) by mouth daily.  Dispense: 30 tablet; Refill: 2  4. Vitamin D deficiency - Vitamin D, Ergocalciferol, (DRISDOL) 1.25 MG (50000 UNIT) CAPS capsule; TAKE 1 CAPSULE BY MOUTH ONE TIME PER WEEK  Dispense: 12 capsule; Refill: 1  5. Acquired hypothyroidism - TSH + free T4  6. Inflammatory polyarthritis (HCC) - Sed Rate (ESR)  7. Other fatigue - TSH + free T4 - CBC w/Diff/Platelet - Comprehensive metabolic panel -  FSH/LH - Estradiol - Sed Rate (ESR) - Hgb A1C w/o eAG  8. FHx: diabetes mellitus - Hgb A1C w/o eAG  9. Mixed hyperlipidemia - Lipid Panel With LDL/HDL Ratio  10.  Dysuria - UA/M w/rflx Culture, Routine   General Counseling: Jaziah verbalizes understanding of the findings of todays visit and agrees with plan of treatment. I have discussed any further diagnostic evaluation that may be needed or ordered today. We also reviewed her medications today. she has been encouraged to call the office with any questions or concerns that should arise related to todays visit.    Counseling:    Orders Placed This Encounter  Procedures   Microscopic Examination   Urine Culture, Reflex   UA/M w/rflx Culture, Routine   Lipid Panel With LDL/HDL Ratio   TSH + free T4   CBC w/Diff/Platelet   Comprehensive metabolic panel   FSH/LH   Estradiol   Sed Rate (ESR)   Hgb A1C w/o eAG    Meds ordered this encounter  Medications   Vitamin D, Ergocalciferol, (DRISDOL) 1.25 MG (50000 UNIT) CAPS capsule    Sig: TAKE 1 CAPSULE BY MOUTH ONE TIME PER WEEK    Dispense:  12 capsule    Refill:  1   venlafaxine XR (EFFEXOR XR) 37.5 MG 24 hr capsule    Sig: Take 1 capsule (37.5 mg total) by mouth daily.    Dispense:  30 capsule    Refill:  1   topiramate (TOPAMAX) 25 MG tablet    Sig: Take 1 tablet (25 mg total) by mouth daily.    Dispense:  30 tablet    Refill:  2    This patient was seen by Drema Dallas, PA-C in collaboration with Dr. Clayborn Bigness as a part of collaborative care agreement.  Total time spent:40 Minutes  Time spent includes review of chart, medications, test results, and follow up plan with the patient.     Lavera Guise, MD  Internal Medicine

## 2022-02-16 LAB — FSH/LH
FSH: 62.6 m[IU]/mL
LH: 44.4 m[IU]/mL

## 2022-02-16 LAB — COMPREHENSIVE METABOLIC PANEL
ALT: 47 IU/L — ABNORMAL HIGH (ref 0–32)
AST: 32 IU/L (ref 0–40)
Albumin/Globulin Ratio: 1.7 (ref 1.2–2.2)
Albumin: 4.2 g/dL (ref 3.9–4.9)
Alkaline Phosphatase: 80 IU/L (ref 44–121)
BUN/Creatinine Ratio: 13 (ref 9–23)
BUN: 12 mg/dL (ref 6–24)
Bilirubin Total: 0.3 mg/dL (ref 0.0–1.2)
CO2: 25 mmol/L (ref 20–29)
Calcium: 9.6 mg/dL (ref 8.7–10.2)
Chloride: 101 mmol/L (ref 96–106)
Creatinine, Ser: 0.9 mg/dL (ref 0.57–1.00)
Globulin, Total: 2.5 g/dL (ref 1.5–4.5)
Glucose: 101 mg/dL — ABNORMAL HIGH (ref 70–99)
Potassium: 4.6 mmol/L (ref 3.5–5.2)
Sodium: 138 mmol/L (ref 134–144)
Total Protein: 6.7 g/dL (ref 6.0–8.5)
eGFR: 80 mL/min/{1.73_m2} (ref >59)

## 2022-02-16 LAB — CBC WITH DIFFERENTIAL/PLATELET
Basophils Absolute: 0.1 10*3/uL (ref 0.0–0.2)
Basos: 1 %
EOS (ABSOLUTE): 0.1 10*3/uL (ref 0.0–0.4)
Eos: 2 %
Hematocrit: 43.4 % (ref 34.0–46.6)
Hemoglobin: 14.6 g/dL (ref 11.1–15.9)
Immature Grans (Abs): 0 10*3/uL (ref 0.0–0.1)
Immature Granulocytes: 0 %
Lymphocytes Absolute: 1.9 10*3/uL (ref 0.7–3.1)
Lymphs: 31 %
MCH: 30.6 pg (ref 26.6–33.0)
MCHC: 33.6 g/dL (ref 31.5–35.7)
MCV: 91 fL (ref 79–97)
Monocytes Absolute: 0.5 10*3/uL (ref 0.1–0.9)
Monocytes: 7 %
Neutrophils Absolute: 3.8 10*3/uL (ref 1.4–7.0)
Neutrophils: 59 %
Platelets: 324 10*3/uL (ref 150–450)
RBC: 4.77 x10E6/uL (ref 3.77–5.28)
RDW: 12.7 % (ref 11.7–15.4)
WBC: 6.3 10*3/uL (ref 3.4–10.8)

## 2022-02-16 LAB — HGB A1C W/O EAG: Hgb A1c MFr Bld: 6 % — ABNORMAL HIGH (ref 4.8–5.6)

## 2022-02-16 LAB — SEDIMENTATION RATE: Sed Rate: 13 mm/hr (ref 0–32)

## 2022-02-16 LAB — LIPID PANEL WITH LDL/HDL RATIO
Cholesterol, Total: 227 mg/dL — ABNORMAL HIGH (ref 100–199)
HDL: 44 mg/dL (ref >39)
LDL Chol Calc (NIH): 150 mg/dL — ABNORMAL HIGH (ref 0–99)
LDL/HDL Ratio: 3.4 ratio — ABNORMAL HIGH (ref 0.0–3.2)
Triglycerides: 183 mg/dL — ABNORMAL HIGH (ref 0–149)
VLDL Cholesterol Cal: 33 mg/dL (ref 5–40)

## 2022-02-16 LAB — ESTRADIOL: Estradiol: 9.8 pg/mL

## 2022-02-16 LAB — TSH+FREE T4
Free T4: 1.14 ng/dL (ref 0.82–1.77)
TSH: 2.39 u[IU]/mL (ref 0.450–4.500)

## 2022-02-22 LAB — UA/M W/RFLX CULTURE, ROUTINE
Bilirubin, UA: NEGATIVE
Glucose, UA: NEGATIVE
Ketones, UA: NEGATIVE
Nitrite, UA: NEGATIVE
Protein,UA: NEGATIVE
RBC, UA: NEGATIVE
Specific Gravity, UA: 1.022 (ref 1.005–1.030)
Urobilinogen, Ur: 0.2 mg/dL (ref 0.2–1.0)
pH, UA: 8 — ABNORMAL HIGH (ref 5.0–7.5)

## 2022-02-22 LAB — MICROSCOPIC EXAMINATION
Casts: NONE SEEN /lpf
Epithelial Cells (non renal): >10 /hpf — AB (ref 0–10)

## 2022-02-22 LAB — URINE CULTURE, REFLEX

## 2022-02-23 ENCOUNTER — Other Ambulatory Visit: Payer: Self-pay | Admitting: Physician Assistant

## 2022-02-23 DIAGNOSIS — N3000 Acute cystitis without hematuria: Secondary | ICD-10-CM

## 2022-02-23 MED ORDER — NITROFURANTOIN MONOHYD MACRO 100 MG PO CAPS: ORAL_CAPSULE | ORAL | 0 refills | Status: DC

## 2022-02-25 ENCOUNTER — Telehealth: Payer: Self-pay

## 2022-02-25 NOTE — Telephone Encounter (Signed)
-----   Message from Lauren K McDonough, PA-C sent at 02/23/2022 12:11 PM EST ----- Please let her know her urine showed signs of UTI and I have sent macrobid to pharmacy for her. Labs will be discussed at visit 

## 2022-02-25 NOTE — Telephone Encounter (Signed)
-----   Message from Carlean Jews, PA-C sent at 02/23/2022 12:11 PM EST ----- Please let her know her urine showed signs of UTI and I have sent macrobid to pharmacy for her. Labs will be discussed at visit

## 2022-02-25 NOTE — Telephone Encounter (Signed)
Pt advised for ua result

## 2022-03-10 ENCOUNTER — Other Ambulatory Visit: Payer: Self-pay | Admitting: Physician Assistant

## 2022-03-10 DIAGNOSIS — N951 Menopausal and female climacteric states: Secondary | ICD-10-CM

## 2022-03-13 ENCOUNTER — Other Ambulatory Visit: Payer: Self-pay | Admitting: Physician Assistant

## 2022-03-13 DIAGNOSIS — E039 Hypothyroidism, unspecified: Secondary | ICD-10-CM

## 2022-03-19 ENCOUNTER — Encounter: Payer: Self-pay | Admitting: Internal Medicine

## 2022-03-19 ENCOUNTER — Ambulatory Visit: Payer: 59 | Admitting: Internal Medicine

## 2022-03-19 VITALS — BP 125/74 | HR 64 | Temp 98.5°F | Resp 16 | Ht 63.0 in | Wt 190.6 lb

## 2022-03-19 DIAGNOSIS — Z6833 Body mass index (BMI) 33.0-33.9, adult: Secondary | ICD-10-CM

## 2022-03-19 DIAGNOSIS — N951 Menopausal and female climacteric states: Secondary | ICD-10-CM

## 2022-03-19 DIAGNOSIS — N959 Unspecified menopausal and perimenopausal disorder: Secondary | ICD-10-CM

## 2022-03-19 DIAGNOSIS — K649 Unspecified hemorrhoids: Secondary | ICD-10-CM

## 2022-03-19 NOTE — Progress Notes (Signed)
[facility] [address] [place], [place] [address]  Internal MEDICINE  Office Visit Note  Patient Name: Jo Hampton  [id number]  [id number]  Date of Service: 04/04/2022  Chief Complaint  Patient presents with   Follow-up    Patient has questions regarding recent labs and hormone testing.   Quality Metric Gaps    Colonoscopy    HPI  Pt is here for for Labs. She hs been having hot flashes, sleeping difficulty, FSH/LH both are elevated. She is not a candidate for HRT due to mother with breast cancer  Pt is also C/O ongoing hemorrhoids, has FB sensation and pain at times, occasional bleeding as well   Discussed wt control/loss guidelines      Current Medication: Outpatient Encounter Medications as of 03/19/2022  Medication Sig   clonazePAM (KLONOPIN) 0.5 MG tablet Take one tab po qhs for hot flashes   cyanocobalamin (VITAMIN B12) 1000 MCG tablet Take 3,000 mcg by mouth daily.   hydrocortisone (PROCTOSOL HC) 2.5 % rectal cream Apply 1 Application topically 2 (two) times daily.   levothyroxine (SYNTHROID) 25 MCG tablet TAKE 1 TABLET BY MOUTH EVERY DAY   nitrofurantoin, macrocrystal-monohydrate, (MACROBID) 100 MG capsule Take 1 cap twice per day for 10 days.   topiramate (TOPAMAX) 25 MG tablet Take 1 tablet (25 mg total) by mouth daily.   valACYclovir (VALTREX) 500 MG tablet Take 1 tablet by mouth daily.   venlafaxine XR (EFFEXOR-XR) 37.5 MG 24 hr capsule TAKE 1 CAPSULE BY MOUTH EVERY DAY   Vitamin D, Ergocalciferol, (DRISDOL) 1.25 MG (50000 UNIT) CAPS capsule TAKE 1 CAPSULE BY MOUTH ONE TIME PER WEEK   Zinc 30 MG TABS Take 35 mg by mouth daily. Take 35mg  daily   No facility-administered encounter medications on file as of 03/19/2022.    Surgical History: Past Surgical History:  Procedure Laterality Date   ovarian  cyst removed      Medical History: History reviewed. No pertinent past medical history.  Family History: Family History  Problem Relation Age of  Onset   Breast cancer Mother 32   Depression Mother     Social History   Socioeconomic History   Marital status: Single    Spouse name: Not on file   Number of children: Not on file   Years of education: Not on file   Highest education level: Not on file  Occupational History   Not on file  Tobacco Use   Smoking status: Never   Smokeless tobacco: Never  Vaping Use   Vaping Use: Never used  Substance and Sexual Activity   Alcohol use: Yes    Comment: drinks socially   Drug use: No   Sexual activity: Not on file  Other Topics Concern   Not on file  Social History Narrative   Not on file   Social Determinants of Health   Financial Resource Strain: Not on file  Food Insecurity: Not on file  Transportation Needs: Not on file  Physical Activity: Not on file  Stress: Not on file  Social Connections: Not on file  Intimate Partner Violence: Not on file      Review of Systems  Constitutional:  Negative for fatigue and fever.  HENT:  Negative for congestion, mouth sores and postnasal drip.   Respiratory:  Negative for cough.   Cardiovascular:  Negative for chest pain.  Gastrointestinal:  Positive for anal bleeding.  Endocrine: Positive for heat intolerance.  Genitourinary:  Negative for flank pain.  Psychiatric/Behavioral: Negative.  Vital Signs: BP 125/74   Pulse 64   Temp 98.5 F (36.9 C)   Resp 16   Ht 5\' 3"  (1.6 m)   Wt 190 lb 9.6 oz (86.5 kg)   SpO2 98%   BMI 33.76 kg/m    Physical Exam Constitutional:      Appearance: Normal appearance.  HENT:     Head: Normocephalic and atraumatic.     Nose: Nose normal.     Mouth/Throat:     Mouth: Mucous membranes are moist.     Pharynx: No posterior oropharyngeal erythema.  Eyes:     Extraocular Movements: Extraocular movements intact.     Pupils: Pupils are equal, round, and reactive to light.  Cardiovascular:     Pulses: Normal pulses.     Heart sounds: Normal heart sounds.  Pulmonary:      Effort: Pulmonary effort is normal.     Breath sounds: Normal breath sounds.  Neurological:     General: No focal deficit present.     Mental Status: She is alert.  Psychiatric:        Mood and Affect: Mood normal.        Behavior: Behavior normal.        Assessment/Plan: 1. Hemorrhoids, unspecified hemorrhoid type Ongoing discomfort, needs to be evaluated, will also need to see GI  - Ambulatory referral to General Surgery  2. Vasomotor symptoms due to menopause Pt is not a candidate for HRT, start low dose Klonopin for control of hot flashes and insonia  - clonazePAM (KLONOPIN) 0.5 MG tablet; Take one tab po qhs for hot flashes  Dispense: 30 tablet; Refill: 2  3. BMI 33.0-33.9,adult Encouraged wt loss    General Counseling: Peytan verbalizes understanding of the findings of todays visit and agrees with plan of treatment. I have discussed any further diagnostic evaluation that may be needed or ordered today. We also reviewed her medications today. she has been encouraged to call the office with any questions or concerns that should arise related to todays visit.    Orders Placed This Encounter  Procedures   Ambulatory referral to General Surgery    Meds ordered this encounter  Medications   clonazePAM (KLONOPIN) 0.5 MG tablet    Sig: Take one tab po qhs for hot flashes    Dispense:  30 tablet    Refill:  2    Total time spent:35 Minutes Time spent includes review of chart, medications, test results, and follow up plan with the patient.   [place] Controlled Substance Database was reviewed by me.   Dr Lavera Guise Internal medicine

## 2022-03-20 MED ORDER — CLONAZEPAM 0.5 MG PO TABS: ORAL_TABLET | ORAL | 2 refills | Status: AC

## 2022-05-30 ENCOUNTER — Telehealth: Payer: Self-pay | Admitting: Physician Assistant

## 2022-05-30 NOTE — Telephone Encounter (Signed)
Returned vm to patient regarding hpv vaccine. Left her vm to return my call-Toni

## 2022-06-18 ENCOUNTER — Telehealth: Payer: Self-pay

## 2022-06-18 ENCOUNTER — Other Ambulatory Visit: Payer: Self-pay

## 2022-06-18 DIAGNOSIS — E039 Hypothyroidism, unspecified: Secondary | ICD-10-CM

## 2022-06-18 NOTE — Telephone Encounter (Signed)
Lmom to call us back returning pt call

## 2022-06-20 ENCOUNTER — Ambulatory Visit: Payer: 59 | Admitting: Physician Assistant

## 2022-07-05 ENCOUNTER — Other Ambulatory Visit: Payer: Self-pay

## 2022-07-05 DIAGNOSIS — E039 Hypothyroidism, unspecified: Secondary | ICD-10-CM

## 2022-07-05 DIAGNOSIS — E559 Vitamin D deficiency, unspecified: Secondary | ICD-10-CM

## 2022-07-05 DIAGNOSIS — N951 Menopausal and female climacteric states: Secondary | ICD-10-CM

## 2022-07-05 MED ORDER — VENLAFAXINE HCL ER 37.5 MG PO CP24: 37.5000 mg | ORAL_CAPSULE | Freq: Every day | ORAL | 0 refills | Status: DC

## 2022-07-05 MED ORDER — LEVOTHYROXINE SODIUM 25 MCG PO TABS: 25.0000 ug | ORAL_TABLET | Freq: Every day | ORAL | 1 refills | Status: DC

## 2022-07-05 MED ORDER — VITAMIN D (ERGOCALCIFEROL) 1.25 MG (50000 UNIT) PO CAPS: ORAL_CAPSULE | ORAL | 1 refills | Status: DC

## 2022-07-18 ENCOUNTER — Ambulatory Visit (INDEPENDENT_AMBULATORY_CARE_PROVIDER_SITE_OTHER): Payer: 59 | Admitting: Physician Assistant

## 2022-07-18 ENCOUNTER — Encounter: Payer: Self-pay | Admitting: Physician Assistant

## 2022-07-18 VITALS — BP 125/78 | HR 77 | Temp 98.4°F | Resp 16 | Ht 63.0 in | Wt 194.2 lb

## 2022-07-18 DIAGNOSIS — N951 Menopausal and female climacteric states: Secondary | ICD-10-CM | POA: Diagnosis not present

## 2022-07-18 DIAGNOSIS — E669 Obesity, unspecified: Secondary | ICD-10-CM | POA: Diagnosis not present

## 2022-07-18 DIAGNOSIS — L309 Dermatitis, unspecified: Secondary | ICD-10-CM

## 2022-07-18 MED ORDER — PHENTERMINE HCL 37.5 MG PO CAPS: 37.5000 mg | ORAL_CAPSULE | ORAL | 0 refills | Status: DC

## 2022-07-18 MED ORDER — TRIAMCINOLONE ACETONIDE 0.1 % EX CREA: 1.0000 | TOPICAL_CREAM | Freq: Two times a day (BID) | CUTANEOUS | 0 refills | Status: AC

## 2022-07-18 NOTE — Progress Notes (Signed)
[facility] [address] [place], [place] [address]  Internal MEDICINE  Office Visit Note  Patient Name: Jo Hampton  [id number]  [id number]  Date of Service: 07/18/2022  Chief Complaint  Patient presents with   Follow-up   Eczema    Patient states creams aren't providing relief.    HPI Pt is here for routine follow up -No hot flashes now, and is having cycles just shorter -Not having any headaches now, stopped the topamax when no recurrences -eczema cream not working as well anymore, unsure what it was. Using just aloe vera now. Will send triamcinolone to uses as needed, but advised to try moisturizer such as cerave otherwise to help maintain -Really working on diet and going to the gym, but not seeing any weight loss results and is feeling discouraged. States clothes are tighter so doesn't even feel like distribution or inches changing despite no scale change.  -Previously tried phentermine and tolerated well and would like to restart short term while continuing to work on diet and exercise. May look into wegovy vs zepbound if interested in alternative and will check with insurance regarding coverage if so.  Current Medication: Outpatient Encounter Medications as of 07/18/2022  Medication Sig   clonazePAM (KLONOPIN) 0.5 MG tablet Take one tab po qhs for hot flashes   cyanocobalamin (VITAMIN B12) 1000 MCG tablet Take 3,000 mcg by mouth daily.   hydrocortisone (PROCTOSOL HC) 2.5 % rectal cream Apply 1 Application topically 2 (two) times daily.   levothyroxine (SYNTHROID) 25 MCG tablet Take 1 tablet (25 mcg total) by mouth daily.   phentermine 37.5 MG capsule Take 1 capsule (37.5 mg total) by mouth every morning.   triamcinolone cream (KENALOG) 0.1 % Apply 1 Application topically 2 (two) times daily.   valACYclovir (VALTREX) 500 MG tablet Take 1 tablet by mouth daily.   venlafaxine XR (EFFEXOR-XR) 37.5 MG 24 hr capsule Take 1 capsule (37.5 mg total) by mouth daily.    Vitamin D, Ergocalciferol, (DRISDOL) 1.25 MG (50000 UNIT) CAPS capsule TAKE 1 CAPSULE BY MOUTH ONE TIME PER WEEK   Zinc 30 MG TABS Take 35 mg by mouth daily. Take 35mg  daily   [DISCONTINUED] nitrofurantoin, macrocrystal-monohydrate, (MACROBID) 100 MG capsule Take 1 cap twice per day for 10 days.   [DISCONTINUED] topiramate (TOPAMAX) 25 MG tablet Take 1 tablet (25 mg total) by mouth daily.   No facility-administered encounter medications on file as of 07/18/2022.    Surgical History: Past Surgical History:  Procedure Laterality Date   ovarian  cyst removed      Medical History: History reviewed. No pertinent past medical history.  Family History: Family History  Problem Relation Age of Onset   Breast cancer Mother 33   Depression Mother     Social History   Socioeconomic History   Marital status: Single    Spouse name: Not on file   Number of children: Not on file   Years of education: Not on file   Highest education level: Not on file  Occupational History   Not on file  Tobacco Use   Smoking status: Never   Smokeless tobacco: Never  Vaping Use   Vaping Use: Never used  Substance and Sexual Activity   Alcohol use: Yes    Comment: drinks socially   Drug use: No   Sexual activity: Not on file  Other Topics Concern   Not on file  Social History Narrative   Not on file   Social Determinants of Health  Financial Resource Strain: Not on file  Food Insecurity: Not on file  Transportation Needs: Not on file  Physical Activity: Not on file  Stress: Not on file  Social Connections: Not on file  Intimate Partner Violence: Not on file      Review of Systems  Constitutional:  Negative for chills, fatigue and unexpected weight change.  HENT:  Negative for congestion, postnasal drip, rhinorrhea, sneezing and sore throat.   Eyes:  Negative for redness.  Respiratory:  Negative for cough, chest tightness and shortness of breath.   Cardiovascular:  Negative for chest  pain and palpitations.  Gastrointestinal:  Negative for abdominal pain, constipation, diarrhea, nausea and vomiting.  Genitourinary:  Negative for dysuria and frequency.  Musculoskeletal:  Negative for arthralgias, back pain, joint swelling and neck pain.  Skin:  Positive for rash.  Neurological: Negative.  Negative for tremors and numbness.  Hematological:  Negative for adenopathy. Does not bruise/bleed easily.  Psychiatric/Behavioral:  Negative for behavioral problems (Depression), sleep disturbance and suicidal ideas. The patient is not nervous/anxious.     Vital Signs: BP 125/78   Pulse 77   Temp 98.4 F (36.9 C)   Resp 16   Ht 5\' 3"  (1.6 m)   Wt 194 lb 3.2 oz (88.1 kg)   SpO2 97%   BMI 34.40 kg/m    Physical Exam Constitutional:      General: She is not in acute distress.    Appearance: Normal appearance. She is obese. She is not ill-appearing.  HENT:     Head: Normocephalic and atraumatic.  Eyes:     Extraocular Movements: Extraocular movements intact.     Pupils: Pupils are equal, round, and reactive to light.  Cardiovascular:     Rate and Rhythm: Normal rate and regular rhythm.  Pulmonary:     Effort: Pulmonary effort is normal. No respiratory distress.  Musculoskeletal:        General: Normal range of motion.  Skin:    General: Skin is warm and dry.     Findings: Rash present.     Comments: Eczema along arms  Neurological:     Mental Status: She is alert and oriented to person, place, and time.     Cranial Nerves: No cranial nerve deficit.     Coordination: Coordination normal.     Gait: Gait normal.  Psychiatric:        Mood and Affect: Mood normal.        Behavior: Behavior normal.        Assessment/Plan: 1. Obesity (BMI 30.0-34.9) Will start phentermine short term while working on diet and exercise - phentermine 37.5 MG capsule; Take 1 capsule (37.5 mg total) by mouth every morning.  Dispense: 30 capsule; Refill: 0  2. Eczema, unspecified  type Will start daily moisturizer such as cerave and may use triamcinolone as needed for severe flares  3. Vasomotor symptoms due to menopause Improved, continue current medications   General Counseling: [name] verbalizes understanding of the findings of todays visit and agrees with plan of treatment. I have discussed any further diagnostic evaluation that may be needed or ordered today. We also reviewed her medications today. she has been encouraged to call the office with any questions or concerns that should arise related to todays visit.    No orders of the defined types were placed in this encounter.   Meds ordered this encounter  Medications   phentermine 37.5 MG capsule    Sig: Take 1 capsule (37.5 mg  total) by mouth every morning.    Dispense:  30 capsule    Refill:  0   triamcinolone cream (KENALOG) 0.1 %    Sig: Apply 1 Application topically 2 (two) times daily.    Dispense:  30 g    Refill:  0    This patient was seen by Lynn Ito, PA-C in collaboration with Dr. Beverely Risen as a part of collaborative care agreement.   Total time spent:30 Minutes Time spent includes review of chart, medications, test results, and follow up plan with the patient.      Dr Lyndon Code Internal medicine

## 2022-09-02 ENCOUNTER — Ambulatory Visit (INDEPENDENT_AMBULATORY_CARE_PROVIDER_SITE_OTHER): Payer: 59 | Admitting: Physician Assistant

## 2022-09-02 ENCOUNTER — Encounter: Payer: Self-pay | Admitting: Physician Assistant

## 2022-09-02 VITALS — BP 118/87 | HR 75 | Temp 98.3°F | Resp 16 | Ht 63.0 in | Wt 192.0 lb

## 2022-09-02 DIAGNOSIS — E669 Obesity, unspecified: Secondary | ICD-10-CM | POA: Diagnosis not present

## 2022-09-02 NOTE — Progress Notes (Signed)
[facility] [address] [place], [place] [address]  Internal MEDICINE  Office Visit Note  Patient Name: Jo Hampton  [id number]  [id number]  Date of Service: 09/17/2022  Chief Complaint  Patient presents with   Follow-up    Weight loss    HPI Pt is here for routine follow up -Went to [place] right after meds were going to be started and held off taking before trip due to feeling irritable on the medication -sister had covid on trip, she was also sick but did not test positive. Had fever and congestion. Found throat is feeling more dry since taking tamiflu back in jan? Unclear if unrelated -down 2lbs since last visit -wants to try the phentermine -will also work on avoiding late night snacks  Current Medication: Outpatient Encounter Medications as of 09/02/2022  Medication Sig   clonazePAM (KLONOPIN) 0.5 MG tablet Take one tab po qhs for hot flashes   cyanocobalamin (VITAMIN B12) 1000 MCG tablet Take 3,000 mcg by mouth daily.   hydrocortisone (PROCTOSOL HC) 2.5 % rectal cream Apply 1 Application topically 2 (two) times daily.   levothyroxine (SYNTHROID) 25 MCG tablet Take 1 tablet (25 mcg total) by mouth daily.   phentermine 37.5 MG capsule Take 1 capsule (37.5 mg total) by mouth every morning.   triamcinolone cream (KENALOG) 0.1 % Apply 1 Application topically 2 (two) times daily.   valACYclovir (VALTREX) 500 MG tablet Take 1 tablet by mouth daily.   venlafaxine XR (EFFEXOR-XR) 37.5 MG 24 hr capsule Take 1 capsule (37.5 mg total) by mouth daily.   Vitamin D, Ergocalciferol, (DRISDOL) 1.25 MG (50000 UNIT) CAPS capsule TAKE 1 CAPSULE BY MOUTH ONE TIME PER WEEK   Zinc 30 MG TABS Take 35 mg by mouth daily. Take 35mg  daily   No facility-administered encounter medications on file as of 09/02/2022.    Surgical History: Past Surgical History:  Procedure Laterality Date   ovarian  cyst removed      Medical History: History reviewed. No pertinent past medical  history.  Family History: Family History  Problem Relation Age of Onset   Breast cancer Mother 62   Depression Mother     Social History   Socioeconomic History   Marital status: Single    Spouse name: Not on file   Number of children: Not on file   Years of education: Not on file   Highest education level: Not on file  Occupational History   Not on file  Tobacco Use   Smoking status: Never   Smokeless tobacco: Never  Vaping Use   Vaping Use: Never used  Substance and Sexual Activity   Alcohol use: Yes    Comment: drinks socially   Drug use: No   Sexual activity: Not on file  Other Topics Concern   Not on file  Social History Narrative   Not on file   Social Determinants of Health   Financial Resource Strain: Not on file  Food Insecurity: Not on file  Transportation Needs: Not on file  Physical Activity: Not on file  Stress: Not on file  Social Connections: Not on file  Intimate Partner Violence: Not on file      Review of Systems  Constitutional:  Negative for chills, fatigue and unexpected weight change.  HENT:  Positive for congestion and postnasal drip. Negative for rhinorrhea, sneezing and sore throat.        Dry throat  Eyes:  Negative for redness.  Respiratory:  Negative for chest tightness  and shortness of breath.   Cardiovascular:  Negative for chest pain and palpitations.  Gastrointestinal:  Negative for abdominal pain, constipation, diarrhea, nausea and vomiting.  Genitourinary:  Negative for dysuria and frequency.  Musculoskeletal:  Negative for arthralgias, back pain, joint swelling and neck pain.  Skin:  Negative for rash.  Neurological: Negative.  Negative for tremors and numbness.  Hematological:  Negative for adenopathy. Does not bruise/bleed easily.  Psychiatric/Behavioral:  Negative for behavioral problems (Depression), sleep disturbance and suicidal ideas. The patient is not nervous/anxious.     Vital Signs: BP 118/87   Pulse 75    Temp 98.3 F (36.8 C)   Resp 16   Ht 5\' 3"  (1.6 m)   Wt 192 lb (87.1 kg)   SpO2 96%   BMI 34.01 kg/m    Physical Exam Vitals and nursing note reviewed.  Constitutional:      Appearance: Normal appearance.  HENT:     Head: Normocephalic and atraumatic.     Nose: Nose normal.     Mouth/Throat:     Mouth: Mucous membranes are moist.     Pharynx: No posterior oropharyngeal erythema.  Eyes:     Extraocular Movements: Extraocular movements intact.     Pupils: Pupils are equal, round, and reactive to light.  Cardiovascular:     Rate and Rhythm: Normal rate and regular rhythm.     Pulses: Normal pulses.     Heart sounds: Normal heart sounds.  Pulmonary:     Effort: Pulmonary effort is normal.     Breath sounds: Normal breath sounds.  Musculoskeletal:        General: Normal range of motion.  Neurological:     General: No focal deficit present.     Mental Status: She is alert.  Psychiatric:        Mood and Affect: Mood normal.        Behavior: Behavior normal.        Assessment/Plan: 1. Obesity (BMI 30.0-34.9) Down 2lbs since last visit. Would like to move forward trying phentermine while working on diet and exercise   General Counseling: Luciann verbalizes understanding of the findings of todays visit and agrees with plan of treatment. I have discussed any further diagnostic evaluation that may be needed or ordered today. We also reviewed her medications today. she has been encouraged to call the office with any questions or concerns that should arise related to todays visit.    No orders of the defined types were placed in this encounter.   No orders of the defined types were placed in this encounter.   This patient was seen by Lynn Ito, PA-C in collaboration with Dr. Beverely Risen as a part of collaborative care agreement.   Total time spent:30 Minutes Time spent includes review of chart, medications, test results, and follow up plan with the patient.       Dr Lyndon Code Internal medicine

## 2022-10-04 ENCOUNTER — Ambulatory Visit: Payer: 59 | Admitting: Physician Assistant

## 2022-10-17 ENCOUNTER — Other Ambulatory Visit: Payer: Self-pay | Admitting: Physician Assistant

## 2022-10-17 DIAGNOSIS — N951 Menopausal and female climacteric states: Secondary | ICD-10-CM

## 2023-01-09 ENCOUNTER — Other Ambulatory Visit: Payer: Self-pay | Admitting: Physician Assistant

## 2023-01-09 DIAGNOSIS — N951 Menopausal and female climacteric states: Secondary | ICD-10-CM

## 2023-02-12 ENCOUNTER — Other Ambulatory Visit: Payer: Self-pay | Admitting: Physician Assistant

## 2023-02-12 DIAGNOSIS — E559 Vitamin D deficiency, unspecified: Secondary | ICD-10-CM

## 2023-02-21 ENCOUNTER — Other Ambulatory Visit: Payer: Self-pay

## 2023-02-21 ENCOUNTER — Ambulatory Visit: Payer: 59 | Admitting: Physician Assistant

## 2023-02-21 ENCOUNTER — Encounter: Payer: Self-pay | Admitting: Physician Assistant

## 2023-02-21 ENCOUNTER — Telehealth: Payer: Self-pay | Admitting: Physician Assistant

## 2023-02-21 VITALS — BP 110/70 | HR 76 | Temp 98.2°F | Resp 16 | Ht 63.0 in | Wt 199.0 lb

## 2023-02-21 DIAGNOSIS — Z0001 Encounter for general adult medical examination with abnormal findings: Secondary | ICD-10-CM | POA: Diagnosis not present

## 2023-02-21 DIAGNOSIS — N6459 Other signs and symptoms in breast: Secondary | ICD-10-CM | POA: Diagnosis not present

## 2023-02-21 DIAGNOSIS — E039 Hypothyroidism, unspecified: Secondary | ICD-10-CM

## 2023-02-21 DIAGNOSIS — Z803 Family history of malignant neoplasm of breast: Secondary | ICD-10-CM | POA: Diagnosis not present

## 2023-02-21 DIAGNOSIS — N951 Menopausal and female climacteric states: Secondary | ICD-10-CM

## 2023-02-21 MED ORDER — VENLAFAXINE HCL ER 37.5 MG PO CP24: 37.5000 mg | ORAL_CAPSULE | Freq: Every day | ORAL | 1 refills | Status: DC

## 2023-02-21 MED ORDER — LEVOTHYROXINE SODIUM 25 MCG PO TABS: 25.0000 ug | ORAL_TABLET | Freq: Every day | ORAL | 1 refills | Status: DC

## 2023-02-21 NOTE — Progress Notes (Unsigned)
[facility] [address] [place], [place] [address]  Internal MEDICINE  Office Visit Note  Patient Name: Jo Hampton  [id number]  [id number]  Date of Service: 02/27/2023  Chief Complaint  Patient presents with   Annual Exam     HPI Pt is here for routine health maintenance examination -Cologuard done in 2023 -saw OBGYN but did not have pap, had visit for perimenopausal symptoms and est care, was supposed to have follow up for routine exam and will schedule with GYN. Per chart last pap 03/28/20 -will have labs done -Some muscle aches/spasms recently trying to get back into more activity again. -States feeling something in neck when swallowing, has not had thyroid US and will order now Left upper inner  Current Medication: Outpatient Encounter Medications as of 02/21/2023  Medication Sig   clonazePAM (KLONOPIN) 0.5 MG tablet Take one tab po qhs for hot flashes   cyanocobalamin (VITAMIN B12) 1000 MCG tablet Take 3,000 mcg by mouth daily.   hydrocortisone (PROCTOSOL HC) 2.5 % rectal cream Apply 1 Application topically 2 (two) times daily.   triamcinolone cream (KENALOG) 0.1 % Apply 1 Application topically 2 (two) times daily.   valACYclovir (VALTREX) 500 MG tablet Take 1 tablet by mouth daily.   Vitamin D, Ergocalciferol, (DRISDOL) 1.25 MG (50000 UNIT) CAPS capsule TAKE 1 CAPSULE BY MOUTH ONE TIME PER WEEK   Zinc 30 MG TABS Take 35 mg by mouth daily. Take 35mg  daily   [DISCONTINUED] levothyroxine (SYNTHROID) 25 MCG tablet Take 1 tablet (25 mcg total) by mouth daily.   [DISCONTINUED] phentermine 37.5 MG capsule Take 1 capsule (37.5 mg total) by mouth every morning.   [DISCONTINUED] venlafaxine XR (EFFEXOR-XR) 37.5 MG 24 hr capsule TAKE 1 CAPSULE BY MOUTH EVERY DAY   No facility-administered encounter medications on file as of 02/21/2023.    Surgical History: Past Surgical History:  Procedure Laterality Date   ovarian  cyst removed      Medical  History: History reviewed. No pertinent past medical history.  Family History: Family History  Problem Relation Age of Onset   Breast cancer Mother 11   Depression Mother       Review of Systems  Constitutional:  Negative for chills, fatigue and unexpected weight change.  HENT:  Negative for congestion, postnasal drip, rhinorrhea, sneezing and sore throat.   Eyes:  Negative for redness.  Respiratory:  Negative for cough, chest tightness and shortness of breath.   Cardiovascular:  Negative for chest pain and palpitations.  Gastrointestinal:  Negative for abdominal pain, constipation, diarrhea, nausea and vomiting.  Genitourinary:  Negative for dysuria and frequency.  Musculoskeletal:  Positive for myalgias. Negative for arthralgias, back pain, joint swelling and neck pain.  Skin:  Negative for rash.  Neurological: Negative.  Negative for tremors and numbness.  Hematological:  Negative for adenopathy. Does not bruise/bleed easily.  Psychiatric/Behavioral:  Negative for behavioral problems (Depression), sleep disturbance and suicidal ideas. The patient is not nervous/anxious.      Vital Signs: BP 110/70   Pulse 76   Temp 98.2 F (36.8 C)   Resp 16   Ht 5\' 3"  (1.6 m)   Wt 199 lb (90.3 kg)   SpO2 97%   BMI 35.25 kg/m    Physical Exam Vitals and nursing note reviewed.  Constitutional:      Appearance: Normal appearance.  HENT:     Head: Normocephalic and atraumatic.     Nose: Nose normal.     Mouth/Throat:  Mouth: Mucous membranes are moist.     Pharynx: No posterior oropharyngeal erythema.  Eyes:     Extraocular Movements: Extraocular movements intact.     Pupils: Pupils are equal, round, and reactive to light.  Cardiovascular:     Rate and Rhythm: Normal rate and regular rhythm.     Pulses: Normal pulses.     Heart sounds: Normal heart sounds.  Pulmonary:     Effort: Pulmonary effort is normal.     Breath sounds: Normal breath sounds.  Chest:     Chest  wall: No tenderness.    Abdominal:     Tenderness: There is no abdominal tenderness.  Musculoskeletal:        General: Normal range of motion.  Neurological:     General: No focal deficit present.     Mental Status: She is alert.  Psychiatric:        Mood and Affect: Mood normal.        Behavior: Behavior normal.      LABS: Recent Results (from the past 2160 hours)  CBC with Differential/Platelet     Status: None   Collection Time: 02/24/23  9:10 AM  Result Value Ref Range   WBC 5.7 3.4 - 10.8 x10E3/uL   RBC 4.62 3.77 - 5.28 x10E6/uL   Hemoglobin 13.9 11.1 - 15.9 g/dL   Hematocrit 16.1 09.6 - 46.6 %   MCV 93 79 - 97 fL   MCH 30.1 26.6 - 33.0 pg   MCHC 32.4 31.5 - 35.7 g/dL   RDW 04.5 40.9 - 81.1 %   Platelets 339 150 - 450 x10E3/uL   Neutrophils 57 Not Estab. %   Lymphs 30 Not Estab. %   Monocytes 9 Not Estab. %   Eos 2 Not Estab. %   Basos 1 Not Estab. %   Neutrophils Absolute 3.2 1.4 - 7.0 x10E3/uL   Lymphocytes Absolute 1.7 0.7 - 3.1 x10E3/uL   Monocytes Absolute 0.5 0.1 - 0.9 x10E3/uL   EOS (ABSOLUTE) 0.1 0.0 - 0.4 x10E3/uL   Basophils Absolute 0.1 0.0 - 0.2 x10E3/uL   Immature Granulocytes 1 Not Estab. %   Immature Grans (Abs) 0.1 0.0 - 0.1 x10E3/uL  Comprehensive metabolic panel     Status: Abnormal   Collection Time: 02/24/23  9:10 AM  Result Value Ref Range   Glucose 117 (H) 70 - 99 mg/dL   BUN 13 6 - 24 mg/dL   Creatinine, Ser 9.14 0.57 - 1.00 mg/dL   eGFR 79 >78 GN/FAO/1.30   BUN/Creatinine Ratio 14 9 - 23   Sodium 140 134 - 144 mmol/L   Potassium 4.9 3.5 - 5.2 mmol/L   Chloride 104 96 - 106 mmol/L   CO2 22 20 - 29 mmol/L   Calcium 8.8 8.7 - 10.2 mg/dL   Total Protein 6.5 6.0 - 8.5 g/dL   Albumin 4.0 3.9 - 4.9 g/dL   Globulin, Total 2.5 1.5 - 4.5 g/dL   Bilirubin Total 0.2 0.0 - 1.2 mg/dL   Alkaline Phosphatase 93 44 - 121 IU/L   AST 22 0 - 40 IU/L   ALT 20 0 - 32 IU/L  Lipid Panel With LDL/HDL Ratio     Status: Abnormal   Collection Time:  02/24/23  9:10 AM  Result Value Ref Range   Cholesterol, Total 248 (H) 100 - 199 mg/dL   Triglycerides 865 (H) 0 - 149 mg/dL   HDL 42 >78 mg/dL   VLDL Cholesterol Cal 43 (H) 5 -  40 mg/dL   LDL Chol Calc (NIH) 119 (H) 0 - 99 mg/dL   LDL/HDL Ratio 3.9 (H) 0.0 - 3.2 ratio    Comment:                                     LDL/HDL Ratio                                             Men  Women                               1/2 Avg.Risk  1.0    1.5                                   Avg.Risk  3.6    3.2                                2X Avg.Risk  6.2    5.0                                3X Avg.Risk  8.0    6.1   B12 and Folate Panel     Status: None   Collection Time: 02/24/23  9:10 AM  Result Value Ref Range   Vitamin B-12 1,190 232 - 1,245 pg/mL   Folate 11.9 >3.0 ng/mL    Comment: A serum folate concentration of less than 3.1 ng/mL is considered to represent clinical deficiency.   Hgb A1c w/o eAG     Status: Abnormal   Collection Time: 02/24/23  9:10 AM  Result Value Ref Range   Hgb A1c MFr Bld 6.0 (H) 4.8 - 5.6 %    Comment:          Prediabetes: 5.7 - 6.4          Diabetes: >6.4          Glycemic control for adults with diabetes: <7.0   T4, free     Status: None   Collection Time: 02/24/23  9:10 AM  Result Value Ref Range   Free T4 0.97 0.82 - 1.77 ng/dL  TSH     Status: None   Collection Time: 02/24/23  9:10 AM  Result Value Ref Range   TSH 3.010 0.450 - 4.500 uIU/mL  Rheumatoid factor     Status: None   Collection Time: 02/24/23  9:10 AM  Result Value Ref Range   Rheumatoid fact SerPl-aCnc 10.9 <14.0 IU/mL  VITAMIN D 25 Hydroxy (Vit-D Deficiency, Fractures)     Status: None   Collection Time: 02/24/23  9:10 AM  Result Value Ref Range   Vit D, 25-Hydroxy 40.9 30.0 - 100.0 ng/mL    Comment: Vitamin D deficiency has been defined by the Institute of Medicine and an Endocrine Society practice guideline as a level of serum 25-OH vitamin D less than 20 ng/mL (1,2). The  Endocrine Society went on to further define vitamin D insufficiency as a level between 21 and 29 ng/mL (2). 1. IOM (Institute of Medicine). 2010. Dietary  reference    intakes for calcium and D. [place]: The    [employer]. 2. Holick MF, Binkley Pemberton, Bischoff-Ferrari HA, et al.    Evaluation, treatment, and prevention of vitamin D    deficiency: an Endocrine Society clinical practice    guideline. JCEM. 2011 Jul; 96(7):1911-30.   ANA w/Reflex     Status: None   Collection Time: 02/24/23  9:10 AM  Result Value Ref Range   Anti Nuclear Antibody (ANA) Negative Negative  Sedimentation rate     Status: None   Collection Time: 02/24/23  9:10 AM  Result Value Ref Range   Sed Rate 13 0 - 32 mm/hr        Assessment/Plan: 1. Encounter for general adult medical examination with abnormal findings (Primary) CPE performed, lab slip given, UTD on colon screening  2. Acquired hypothyroidism Will check thyroid US and labs - US THYROID; Future  3. Abnormal breast exam - MM 3D DIAGNOSTIC MAMMOGRAM BILATERAL BREAST; Future  4. FHx: breast cancer in first degree relative - MM 3D DIAGNOSTIC MAMMOGRAM BILATERAL BREAST; Future   General Counseling: Laurena verbalizes understanding of the findings of todays visit and agrees with plan of treatment. I have discussed any further diagnostic evaluation that may be needed or ordered today. We also reviewed her medications today. she has been encouraged to call the office with any questions or concerns that should arise related to todays visit.    Counseling:    Orders Placed This Encounter  Procedures   MM 3D DIAGNOSTIC MAMMOGRAM BILATERAL BREAST   US THYROID    No orders of the defined types were placed in this encounter.   This patient was seen by Lynn Ito, PA-C in collaboration with Dr. Beverely Risen as a part of collaborative care agreement.  Total time spent:35 Minutes  Time spent includes review of chart,  medications, test results, and follow up plan with the patient.     Lyndon Code, MD  Internal Medicine

## 2023-02-21 NOTE — Telephone Encounter (Signed)
Lvm for patient to return call regarding u/s-Toni

## 2023-02-24 ENCOUNTER — Other Ambulatory Visit: Payer: Self-pay | Admitting: Physician Assistant

## 2023-02-24 DIAGNOSIS — N6459 Other signs and symptoms in breast: Secondary | ICD-10-CM

## 2023-02-25 LAB — CBC WITH DIFFERENTIAL/PLATELET
Basophils Absolute: 0.1 10*3/uL (ref 0.0–0.2)
Basos: 1 %
EOS (ABSOLUTE): 0.1 10*3/uL (ref 0.0–0.4)
Eos: 2 %
Hematocrit: 42.9 % (ref 34.0–46.6)
Hemoglobin: 13.9 g/dL (ref 11.1–15.9)
Immature Grans (Abs): 0.1 10*3/uL (ref 0.0–0.1)
Immature Granulocytes: 1 %
Lymphocytes Absolute: 1.7 10*3/uL (ref 0.7–3.1)
Lymphs: 30 %
MCH: 30.1 pg (ref 26.6–33.0)
MCHC: 32.4 g/dL (ref 31.5–35.7)
MCV: 93 fL (ref 79–97)
Monocytes Absolute: 0.5 10*3/uL (ref 0.1–0.9)
Monocytes: 9 %
Neutrophils Absolute: 3.2 10*3/uL (ref 1.4–7.0)
Neutrophils: 57 %
Platelets: 339 10*3/uL (ref 150–450)
RBC: 4.62 x10E6/uL (ref 3.77–5.28)
RDW: 13 % (ref 11.7–15.4)
WBC: 5.7 10*3/uL (ref 3.4–10.8)

## 2023-02-25 LAB — TSH: TSH: 3.01 u[IU]/mL (ref 0.450–4.500)

## 2023-02-25 LAB — B12 AND FOLATE PANEL
Folate: 11.9 ng/mL (ref >3.0)
Vitamin B-12: 1190 pg/mL (ref 232–1245)

## 2023-02-25 LAB — COMPREHENSIVE METABOLIC PANEL
ALT: 20 [IU]/L (ref 0–32)
AST: 22 [IU]/L (ref 0–40)
Albumin: 4 g/dL (ref 3.9–4.9)
Alkaline Phosphatase: 93 [IU]/L (ref 44–121)
BUN/Creatinine Ratio: 14 (ref 9–23)
BUN: 13 mg/dL (ref 6–24)
Bilirubin Total: 0.2 mg/dL (ref 0.0–1.2)
CO2: 22 mmol/L (ref 20–29)
Calcium: 8.8 mg/dL (ref 8.7–10.2)
Chloride: 104 mmol/L (ref 96–106)
Creatinine, Ser: 0.9 mg/dL (ref 0.57–1.00)
Globulin, Total: 2.5 g/dL (ref 1.5–4.5)
Glucose: 117 mg/dL — ABNORMAL HIGH (ref 70–99)
Potassium: 4.9 mmol/L (ref 3.5–5.2)
Sodium: 140 mmol/L (ref 134–144)
Total Protein: 6.5 g/dL (ref 6.0–8.5)
eGFR: 79 mL/min/{1.73_m2} (ref >59)

## 2023-02-25 LAB — LIPID PANEL WITH LDL/HDL RATIO
Cholesterol, Total: 248 mg/dL — ABNORMAL HIGH (ref 100–199)
HDL: 42 mg/dL (ref >39)
LDL Chol Calc (NIH): 163 mg/dL — ABNORMAL HIGH (ref 0–99)
LDL/HDL Ratio: 3.9 {ratio} — ABNORMAL HIGH (ref 0.0–3.2)
Triglycerides: 231 mg/dL — ABNORMAL HIGH (ref 0–149)
VLDL Cholesterol Cal: 43 mg/dL — ABNORMAL HIGH (ref 5–40)

## 2023-02-25 LAB — HGB A1C W/O EAG: Hgb A1c MFr Bld: 6 % — ABNORMAL HIGH (ref 4.8–5.6)

## 2023-02-25 LAB — RHEUMATOID FACTOR: Rheumatoid fact SerPl-aCnc: 10.9 [IU]/mL (ref <14.0)

## 2023-02-25 LAB — T4, FREE: Free T4: 0.97 ng/dL (ref 0.82–1.77)

## 2023-02-25 LAB — SEDIMENTATION RATE: Sed Rate: 13 mm/h (ref 0–32)

## 2023-02-25 LAB — ANA W/REFLEX: Anti Nuclear Antibody (ANA): NEGATIVE

## 2023-02-25 LAB — VITAMIN D 25 HYDROXY (VIT D DEFICIENCY, FRACTURES): Vit D, 25-Hydroxy: 40.9 ng/mL (ref 30.0–100.0)

## 2023-02-26 ENCOUNTER — Other Ambulatory Visit: Payer: Self-pay

## 2023-02-26 ENCOUNTER — Telehealth: Payer: Self-pay

## 2023-02-26 MED ORDER — CYCLOBENZAPRINE HCL 5 MG PO TABS: 5.0000 mg | ORAL_TABLET | Freq: Every day | ORAL | 0 refills | Status: DC

## 2023-02-26 NOTE — Telephone Encounter (Signed)
Pt advised that we send med  

## 2023-02-26 NOTE — Telephone Encounter (Signed)
Lmom to pt that we send low dose of flexeril at bedtime it can make you drowsy

## 2023-02-27 ENCOUNTER — Ambulatory Visit
Admission: RE | Admit: 2023-02-27 | Discharge: 2023-02-27 | Disposition: A | Discharge status: Home / Self Care | Payer: 59 | Source: Ambulatory Visit | Attending: Physician Assistant | Admitting: Physician Assistant

## 2023-02-27 ENCOUNTER — Ambulatory Visit: Payer: BC Managed Care – PPO

## 2023-02-27 DIAGNOSIS — N6459 Other signs and symptoms in breast: Secondary | ICD-10-CM | POA: Diagnosis present

## 2023-03-21 ENCOUNTER — Ambulatory Visit: Payer: 59 | Admitting: Physician Assistant

## 2023-03-21 ENCOUNTER — Encounter: Payer: Self-pay | Admitting: Physician Assistant

## 2023-03-21 VITALS — BP 110/85 | HR 77 | Temp 98.1°F | Resp 16 | Ht 63.0 in | Wt 201.8 lb

## 2023-03-21 DIAGNOSIS — E039 Hypothyroidism, unspecified: Secondary | ICD-10-CM

## 2023-03-21 DIAGNOSIS — E782 Mixed hyperlipidemia: Secondary | ICD-10-CM | POA: Diagnosis not present

## 2023-03-21 DIAGNOSIS — R7303 Prediabetes: Secondary | ICD-10-CM | POA: Diagnosis not present

## 2023-03-21 DIAGNOSIS — E669 Obesity, unspecified: Secondary | ICD-10-CM

## 2023-03-21 MED ORDER — ROSUVASTATIN CALCIUM 5 MG PO TABS: ORAL_TABLET | ORAL | 1 refills | Status: DC

## 2023-03-21 NOTE — Progress Notes (Signed)
 [facility] [address] [place], [place] [address]  Internal MEDICINE  Office Visit Note  Patient Name: Jo Hampton  [id number]  [id number]  Date of Service: 03/21/2023  Chief Complaint  Patient presents with   Follow-up    Review labs and US     HPI Pt is here for routine follow up -canceled thyroid  US , due to timing with mammogram and concern that it may not be covered. Prefers to hold off on rescheduling for now since labs still stable -Labs reviewed: BG elevated, A1c stable in prediabetic range, cholesterol worse--open to trying crestor  in addition to further diet and exercise changes -having some back aches still, arm aches, also some knee pain. May be partially perimenopausal symptoms. Has seen ortho before and plans to follow up. Inflammatory markers on labs normal. May benefit from PT in future -diagnostic mammo and US  normal. States they had discussed possible program at [facility] where high risk patient scan be screened regularly with possible MRI. She is going to look into this further. States mom had breast cancer twice, but that she did not have gene.  Current Medication: Outpatient Encounter Medications as of 03/21/2023  Medication Sig   clonazePAM  (KLONOPIN ) 0.5 MG tablet Take one tab po qhs for hot flashes   cyanocobalamin (VITAMIN B12) 1000 MCG tablet Take 3,000 mcg by mouth daily.   cyclobenzaprine  (FLEXERIL ) 5 MG tablet Take 1 tablet (5 mg total) by mouth at bedtime.   hydrocortisone  (PROCTOSOL HC) 2.5 % rectal cream Apply 1 Application topically 2 (two) times daily.   levothyroxine  (SYNTHROID ) 25 MCG tablet Take 1 tablet (25 mcg total) by mouth daily.   rosuvastatin  (CRESTOR ) 5 MG tablet Take 1 tablet by mouth 2 times per week   triamcinolone  cream (KENALOG ) 0.1 % Apply 1 Application topically 2 (two) times daily.   valACYclovir  (VALTREX ) 500 MG tablet Take 1 tablet by mouth daily.   venlafaxine  XR (EFFEXOR -XR) 37.5 MG 24 hr capsule Take 1 capsule (37.5 mg  total) by mouth daily.   Vitamin D , Ergocalciferol , (DRISDOL ) 1.25 MG (50000 UNIT) CAPS capsule TAKE 1 CAPSULE BY MOUTH ONE TIME PER WEEK   Zinc 30 MG TABS Take 35 mg by mouth daily. Take 35mg  daily   No facility-administered encounter medications on file as of 03/21/2023.    Surgical History: Past Surgical History:  Procedure Laterality Date   ovarian  cyst removed      Medical History: History reviewed. No pertinent past medical history.  Family History: Family History  Problem Relation Age of Onset   Breast cancer Mother 45   Depression Mother     Social History   Socioeconomic History   Marital status: Single    Spouse name: Not on file   Number of children: Not on file   Years of education: Not on file   Highest education level: Not on file  Occupational History   Not on file  Tobacco Use   Smoking status: Never   Smokeless tobacco: Never  Vaping Use   Vaping status: Never Used  Substance and Sexual Activity   Alcohol use: Yes    Comment: drinks socially   Drug use: No   Sexual activity: Not on file  Other Topics Concern   Not on file  Social History Narrative   Not on file   Social Drivers of Health   Financial Resource Strain: Not on file  Food Insecurity: Not on file  Transportation Needs: Not on file  Physical Activity: Sufficiently Active (06/20/2021)  Received from [facility], [facility]   Exercise Vital Sign    Days of Exercise per Week: 3 days    Minutes of Exercise per Session: 50 min  Stress: Not on file  Social Connections: Unknown (03/28/2020)   Received from [facility], [facility]   Social Connection and Isolation Panel [NHANES]    Frequency of Communication with Friends and Family: Not on file    Frequency of Social Gatherings with Friends and Family: Not on file    Attends Religious Services: Not on file    Active Member of Clubs or Organizations: Not on file    Attends Banker Meetings: Not on file     Marital Status: Never married  Intimate Partner Violence: Not At Risk (06/20/2021)   Received from [facility], [facility]   Humiliation, Afraid, Rape, and Kick questionnaire    Fear of Current or Ex-Partner: No    Emotionally Abused: No    Physically Abused: No    Sexually Abused: No      Review of Systems  Constitutional:  Negative for chills, fatigue and unexpected weight change.  HENT:  Negative for congestion, postnasal drip, rhinorrhea, sneezing and sore throat.   Eyes:  Negative for redness.  Respiratory:  Negative for cough, chest tightness and shortness of breath.   Cardiovascular:  Negative for chest pain and palpitations.  Gastrointestinal:  Negative for abdominal pain, constipation, diarrhea, nausea and vomiting.  Genitourinary:  Negative for dysuria and frequency.  Musculoskeletal:  Positive for arthralgias, back pain and myalgias. Negative for joint swelling and neck pain.  Skin:  Negative for rash.  Neurological: Negative.  Negative for tremors and numbness.  Hematological:  Negative for adenopathy. Does not bruise/bleed easily.  Psychiatric/Behavioral:  Negative for behavioral problems (Depression), sleep disturbance and suicidal ideas. The patient is not nervous/anxious.     Vital Signs: BP 110/85   Pulse 77   Temp 98.1 F (36.7 C)   Resp 16   Ht 5' 3 (1.6 m)   Wt 201 lb 12.8 oz (91.5 kg)   SpO2 98%   BMI 35.75 kg/m    Physical Exam Vitals and nursing note reviewed.  Constitutional:      Appearance: Normal appearance.  HENT:     Head: Normocephalic and atraumatic.     Nose: Nose normal.     Mouth/Throat:     Mouth: Mucous membranes are moist.     Pharynx: No posterior oropharyngeal erythema.  Eyes:     Extraocular Movements: Extraocular movements intact.     Pupils: Pupils are equal, round, and reactive to light.  Cardiovascular:     Rate and Rhythm: Normal rate and regular rhythm.     Pulses: Normal pulses.     Heart sounds:  Normal heart sounds.  Pulmonary:     Effort: Pulmonary effort is normal.     Breath sounds: Normal breath sounds.  Musculoskeletal:        General: Normal range of motion.  Neurological:     General: No focal deficit present.     Mental Status: She is alert.  Psychiatric:        Mood and Affect: Mood normal.        Behavior: Behavior normal.        Assessment/Plan: 1. Mixed hyperlipidemia (Primary) Will start on crestor  2x per week, pt will also diet and exercise - rosuvastatin  (CRESTOR ) 5 MG tablet; Take 1 tablet by mouth 2  times per week  Dispense: 30 tablet; Refill: 1  2. Acquired hypothyroidism Continue Synthroid  as before  3. Prediabetes A1c stable at 6, continue to work on diet and exercise  4. Obesity (BMI 30-39.9) Looking to work on weight loss goals and plans to improve diet as well as exercise as able  General Counseling: Kaneisha verbalizes understanding of the findings of todays visit and agrees with plan of treatment. I have discussed any further diagnostic evaluation that may be needed or ordered today. We also reviewed her medications today. she has been encouraged to call the office with any questions or concerns that should arise related to todays visit.    No orders of the defined types were placed in this encounter.   Meds ordered this encounter  Medications   rosuvastatin  (CRESTOR ) 5 MG tablet    Sig: Take 1 tablet by mouth 2 times per week    Dispense:  30 tablet    Refill:  1    This patient was seen by Tinnie Pro, PA-C in collaboration with Dr. Sigrid Bathe as a part of collaborative care agreement.   Total time spent:30 Minutes Time spent includes review of chart, medications, test results, and follow up plan with the patient.      Dr Fozia M Khan Internal medicine

## 2023-05-14 ENCOUNTER — Telehealth: Payer: Self-pay

## 2023-05-15 ENCOUNTER — Other Ambulatory Visit: Payer: Self-pay

## 2023-05-15 MED ORDER — HYDROCORTISONE (PERIANAL) 2.5 % EX CREA: 1.0000 | TOPICAL_CREAM | Freq: Two times a day (BID) | CUTANEOUS | 0 refills | Status: AC

## 2023-05-15 NOTE — Telephone Encounter (Signed)
 Lmom for pt that we send med

## 2023-09-06 ENCOUNTER — Other Ambulatory Visit: Payer: Self-pay | Admitting: Physician Assistant

## 2023-10-23 ENCOUNTER — Encounter: Payer: Self-pay | Admitting: Physician Assistant

## 2023-10-23 ENCOUNTER — Ambulatory Visit (INDEPENDENT_AMBULATORY_CARE_PROVIDER_SITE_OTHER): Admitting: Physician Assistant

## 2023-10-23 VITALS — BP 120/70 | HR 69 | Temp 98.1°F | Resp 16 | Ht 63.0 in | Wt 195.0 lb

## 2023-10-23 DIAGNOSIS — E559 Vitamin D deficiency, unspecified: Secondary | ICD-10-CM

## 2023-10-23 DIAGNOSIS — E039 Hypothyroidism, unspecified: Secondary | ICD-10-CM

## 2023-10-23 DIAGNOSIS — R7303 Prediabetes: Secondary | ICD-10-CM

## 2023-10-23 DIAGNOSIS — E782 Mixed hyperlipidemia: Secondary | ICD-10-CM | POA: Diagnosis not present

## 2023-10-23 DIAGNOSIS — E538 Deficiency of other specified B group vitamins: Secondary | ICD-10-CM

## 2023-10-23 DIAGNOSIS — R5383 Other fatigue: Secondary | ICD-10-CM

## 2023-10-23 NOTE — Progress Notes (Signed)
 [facility] [address] [place], [place] [address]  Internal MEDICINE  Office Visit Note  Patient Name: Jo Hampton  [id number]  [id number]  Date of Service: 10/23/2023  Chief Complaint  Patient presents with   Follow-up    HPI Pt is here for routine follow up -just came from the dentist -got cramps with crestor  even 2x per week so stopped this after about 1 week. Has been making diet changes avoiding fried foods, and exercising more.  -down 6lbs since last visit and has been working on this -due for repeat labs  Current Medication: Outpatient Encounter Medications as of 10/23/2023  Medication Sig   clonazePAM  (KLONOPIN ) 0.5 MG tablet Take one tab po qhs for hot flashes   cyanocobalamin (VITAMIN B12) 1000 MCG tablet Take 3,000 mcg by mouth daily.   cyclobenzaprine  (FLEXERIL ) 5 MG tablet TAKE 1 TABLET BY MOUTH EVERYDAY AT BEDTIME   hydrocortisone  (PROCTOSOL HC) 2.5 % rectal cream Apply 1 Application topically 2 (two) times daily.   triamcinolone  cream (KENALOG ) 0.1 % Apply 1 Application topically 2 (two) times daily.   Vitamin D , Ergocalciferol , (DRISDOL ) 1.25 MG (50000 UNIT) CAPS capsule TAKE 1 CAPSULE BY MOUTH ONE TIME PER WEEK   Zinc 30 MG TABS Take 35 mg by mouth daily. Take 35mg  daily   [DISCONTINUED] levothyroxine  (SYNTHROID ) 25 MCG tablet Take 1 tablet (25 mcg total) by mouth daily.   [DISCONTINUED] rosuvastatin  (CRESTOR ) 5 MG tablet Take 1 tablet by mouth 2 times per week   [DISCONTINUED] valACYclovir  (VALTREX ) 500 MG tablet Take 1 tablet by mouth daily.   [DISCONTINUED] venlafaxine  XR (EFFEXOR -XR) 37.5 MG 24 hr capsule Take 1 capsule (37.5 mg total) by mouth daily.   No facility-administered encounter medications on file as of 10/23/2023.    Surgical History: Past Surgical History:  Procedure Laterality Date   ovarian  cyst removed      Medical History: History reviewed. No pertinent past medical history.  Family History: Family History  Problem  Relation Age of Onset   Breast cancer Mother 41   Depression Mother     Social History   Socioeconomic History   Marital status: Single    Spouse name: Not on file   Number of children: Not on file   Years of education: Not on file   Highest education level: Not on file  Occupational History   Not on file  Tobacco Use   Smoking status: Never   Smokeless tobacco: Never  Vaping Use   Vaping status: Never Used  Substance and Sexual Activity   Alcohol use: Yes    Comment: drinks socially   Drug use: No   Sexual activity: Not on file  Other Topics Concern   Not on file  Social History Narrative   Not on file   Social Drivers of Health   Financial Resource Strain: Low Risk  (09/09/2023)   Received from [facility]   Overall Financial Resource Strain (CARDIA)    Difficulty of Paying Living Expenses: Not very hard  Food Insecurity: No Food Insecurity (09/09/2023)   Received from [facility]   Hunger Vital Sign    Within the past 12 months, you worried that your food would run out before you got the money to buy more.: Never true    Within the past 12 months, the food you bought just didn't last and you didn't have money to get more.: Never true  Transportation Needs: No Transportation Needs (09/09/2023)  Received from [facility]    In the past 12 months, has lack of transportation kept you from medical appointments or from getting medications?: No    Lack of Transportation (Non-Medical): No  Physical Activity: Sufficiently Active (06/20/2021)   Received from [facility]   Exercise Vital Sign    On average, how many days per week do you engage in moderate to strenuous exercise (like a brisk walk)?: 3 days    On average, how many minutes do you engage in exercise at this level?: 50 min  Stress: Not on file  Social Connections: Unknown (03/28/2020)   Received from [facility]   Social  Connection and Isolation Panel    Frequency of Communication with Friends and Family: Not on file    Frequency of Social Gatherings with Friends and Family: Not on file    Attends Religious Services: Not on file    Active Member of Clubs or Organizations: Not on file    Attends Club or Organization Meetings: Not on file    Are you married, widowed, divorced, separated, never married, or living with a partner?: Never married  Intimate Partner Violence: Not At Risk (06/20/2021)   Received from [facility]   Humiliation, Afraid, Rape, and Kick questionnaire    Within the last year, have you been afraid of your partner or ex-partner?: No    Within the last year, have you been humiliated or emotionally abused in other ways by your partner or ex-partner?: No    Within the last year, have you been kicked, hit, slapped, or otherwise physically hurt by your partner or ex-partner?: No    Within the last year, have you been raped or forced to have any kind of sexual activity by your partner or ex-partner?: No      Review of Systems  Constitutional:  Negative for chills, fatigue and unexpected weight change.  HENT:  Negative for congestion, postnasal drip, rhinorrhea, sneezing and sore throat.   Eyes:  Negative for redness.  Respiratory:  Negative for cough, chest tightness and shortness of breath.   Cardiovascular:  Negative for chest pain and palpitations.  Gastrointestinal:  Negative for abdominal pain, constipation, diarrhea, nausea and vomiting.  Genitourinary:  Negative for dysuria and frequency.  Musculoskeletal:  Positive for myalgias. Negative for arthralgias, back pain, joint swelling and neck pain.  Skin:  Negative for rash.  Neurological: Negative.  Negative for tremors and numbness.  Hematological:  Negative for adenopathy. Does not bruise/bleed easily.  Psychiatric/Behavioral:  Negative for behavioral problems (Depression), sleep disturbance and suicidal ideas. The patient is not  nervous/anxious.     Vital Signs: BP 120/70 Comment: 141/68  Pulse 69   Temp 98.1 F (36.7 C)   Resp 16   Ht 5' 3 (1.6 m)   Wt 195 lb (88.5 kg)   SpO2 99%   BMI 34.54 kg/m    Physical Exam Vitals and nursing note reviewed.  Constitutional:      Appearance: Normal appearance.  HENT:     Head: Normocephalic and atraumatic.  Eyes:     Extraocular Movements: Extraocular movements intact.  Cardiovascular:     Rate and Rhythm: Normal rate and regular rhythm.     Pulses: Normal pulses.     Heart sounds: Normal heart sounds.  Pulmonary:     Effort: Pulmonary effort is normal.     Breath sounds: Normal breath sounds.  Musculoskeletal:  General: Normal range of motion.  Neurological:     General: No focal deficit present.     Mental Status: She is alert.  Psychiatric:        Mood and Affect: Mood normal.        Behavior: Behavior normal.        Assessment/Plan: 1. Prediabetes (Primary) Will update labs for monitoring - Hgb A1C w/o eAG  2. Acquired hypothyroidism Will update labs and adjust synthroid  accordingly - TSH + free T4  3. Mixed hyperlipidemia S/E crestor , will recheck labs and may be open to trying less intense statin - Lipid Panel With LDL/HDL Ratio  4. Vitamin D  deficiency - VITAMIN D  25 Hydroxy (Vit-D Deficiency, Fractures)  5. B12 deficiency - B12 and Folate Panel  6. Other fatigue - CBC w/Diff/Platelet - Comprehensive metabolic panel with GFR - TSH + free T4 - Hgb A1C w/o eAG - VITAMIN D  25 Hydroxy (Vit-D Deficiency, Fractures) - B12 and Folate Panel - Lipid Panel With LDL/HDL Ratio   General Counseling: Saanvi verbalizes understanding of the findings of todays visit and agrees with plan of treatment. I have discussed any further diagnostic evaluation that may be needed or ordered today. We also reviewed her medications today. she has been encouraged to call the office with any questions or concerns that should arise related to  todays visit.    Orders Placed This Encounter  Procedures   CBC w/Diff/Platelet   Comprehensive metabolic panel with GFR   TSH + free T4   Hgb A1C w/o eAG   VITAMIN D  25 Hydroxy (Vit-D Deficiency, Fractures)   B12 and Folate Panel   Lipid Panel With LDL/HDL Ratio    No orders of the defined types were placed in this encounter.   This patient was seen by Tinnie Pro, PA-C in collaboration with Dr. Sigrid Bathe as a part of collaborative care agreement.   Total time spent:30 Minutes Time spent includes review of chart, medications, test results, and follow up plan with the patient.      Dr Fozia M Khan Internal medicine

## 2023-10-25 LAB — LIPID PANEL WITH LDL/HDL RATIO
Cholesterol, Total: 260 mg/dL — ABNORMAL HIGH (ref 100–199)
HDL: 41 mg/dL (ref >39)
LDL Chol Calc (NIH): 191 mg/dL — ABNORMAL HIGH (ref 0–99)
LDL/HDL Ratio: 4.7 ratio — ABNORMAL HIGH (ref 0.0–3.2)
Triglycerides: 149 mg/dL (ref 0–149)
VLDL Cholesterol Cal: 28 mg/dL (ref 5–40)

## 2023-10-25 LAB — CBC WITH DIFFERENTIAL/PLATELET
Basophils Absolute: 0.1 x10E3/uL (ref 0.0–0.2)
Basos: 1 %
EOS (ABSOLUTE): 0.1 x10E3/uL (ref 0.0–0.4)
Eos: 2 %
Hematocrit: 46.1 % (ref 34.0–46.6)
Hemoglobin: 15 g/dL (ref 11.1–15.9)
Immature Grans (Abs): 0 x10E3/uL (ref 0.0–0.1)
Immature Granulocytes: 0 %
Lymphocytes Absolute: 2.6 x10E3/uL (ref 0.7–3.1)
Lymphs: 37 %
MCH: 30.7 pg (ref 26.6–33.0)
MCHC: 32.5 g/dL (ref 31.5–35.7)
MCV: 94 fL (ref 79–97)
Monocytes Absolute: 0.6 x10E3/uL (ref 0.1–0.9)
Monocytes: 8 %
Neutrophils Absolute: 3.6 x10E3/uL (ref 1.4–7.0)
Neutrophils: 52 %
Platelets: 339 x10E3/uL (ref 150–450)
RBC: 4.89 x10E6/uL (ref 3.77–5.28)
RDW: 13.1 % (ref 11.7–15.4)
WBC: 7 x10E3/uL (ref 3.4–10.8)

## 2023-10-25 LAB — COMPREHENSIVE METABOLIC PANEL WITH GFR
ALT: 19 IU/L (ref 0–32)
AST: 18 IU/L (ref 0–40)
Albumin: 4.2 g/dL (ref 3.9–4.9)
Alkaline Phosphatase: 87 IU/L (ref 44–121)
BUN/Creatinine Ratio: 14 (ref 9–23)
BUN: 15 mg/dL (ref 6–24)
Bilirubin Total: 0.3 mg/dL (ref 0.0–1.2)
CO2: 23 mmol/L (ref 20–29)
Calcium: 9.6 mg/dL (ref 8.7–10.2)
Chloride: 101 mmol/L (ref 96–106)
Creatinine, Ser: 1.05 mg/dL — ABNORMAL HIGH (ref 0.57–1.00)
Globulin, Total: 2.7 g/dL (ref 1.5–4.5)
Glucose: 112 mg/dL — ABNORMAL HIGH (ref 70–99)
Potassium: 4.8 mmol/L (ref 3.5–5.2)
Sodium: 138 mmol/L (ref 134–144)
Total Protein: 6.9 g/dL (ref 6.0–8.5)
eGFR: 66 mL/min/1.73 (ref >59)

## 2023-10-25 LAB — TSH+FREE T4
Free T4: 0.88 ng/dL (ref 0.82–1.77)
TSH: 4.1 u[IU]/mL (ref 0.450–4.500)

## 2023-10-25 LAB — B12 AND FOLATE PANEL
Folate: 8.1 ng/mL (ref >3.0)
Vitamin B-12: >2000 pg/mL — ABNORMAL HIGH (ref 232–1245)

## 2023-10-25 LAB — HGB A1C W/O EAG: Hgb A1c MFr Bld: 5.7 % — ABNORMAL HIGH (ref 4.8–5.6)

## 2023-10-25 LAB — VITAMIN D 25 HYDROXY (VIT D DEFICIENCY, FRACTURES): Vit D, 25-Hydroxy: 44.1 ng/mL (ref 30.0–100.0)

## 2023-10-30 ENCOUNTER — Other Ambulatory Visit: Payer: Self-pay

## 2023-10-30 ENCOUNTER — Encounter: Payer: Self-pay | Admitting: Physician Assistant

## 2023-10-30 DIAGNOSIS — E039 Hypothyroidism, unspecified: Secondary | ICD-10-CM

## 2023-10-30 DIAGNOSIS — N951 Menopausal and female climacteric states: Secondary | ICD-10-CM

## 2023-10-30 MED ORDER — VALACYCLOVIR HCL 500 MG PO TABS: 500.0000 mg | ORAL_TABLET | Freq: Every day | ORAL | 0 refills | Status: AC

## 2023-10-30 MED ORDER — VENLAFAXINE HCL ER 37.5 MG PO CP24: 37.5000 mg | ORAL_CAPSULE | Freq: Every day | ORAL | 1 refills | Status: AC

## 2023-10-30 MED ORDER — LEVOTHYROXINE SODIUM 25 MCG PO TABS: 25.0000 ug | ORAL_TABLET | Freq: Every day | ORAL | 1 refills | Status: AC

## 2023-10-30 MED ORDER — PRAVASTATIN SODIUM 10 MG PO TABS: 10.0000 mg | ORAL_TABLET | Freq: Every day | ORAL | 1 refills | Status: AC

## 2023-10-30 NOTE — Telephone Encounter (Signed)
 Pt advised as per lauren sent pravastatin ,levothyroxine ,venlafaxine  and valacyclovir  and advised she don't need vitamin D  pres

## 2024-01-27 ENCOUNTER — Encounter: Payer: Self-pay | Admitting: Physician Assistant

## 2024-02-11 ENCOUNTER — Ambulatory Visit (INDEPENDENT_AMBULATORY_CARE_PROVIDER_SITE_OTHER): Admitting: Nurse Practitioner

## 2024-02-11 ENCOUNTER — Encounter: Payer: Self-pay | Admitting: Nurse Practitioner

## 2024-02-11 VITALS — BP 120/80 | HR 63 | Temp 96.2°F | Resp 16 | Ht 63.0 in | Wt 188.0 lb

## 2024-02-11 DIAGNOSIS — M79642 Pain in left hand: Secondary | ICD-10-CM

## 2024-02-11 NOTE — Progress Notes (Signed)
 [facility] [address] [place], [place] [address]  Internal MEDICINE  Office Visit Note  Patient Name: Jo Hampton  [id number]  [id number]  Date of Service: 02/11/2024  Chief Complaint  Patient presents with   Acute Visit    Left hand pain     HPI Jo Hampton presents for an acute sick visit for left hand pain  --onset of left hand pain was about 2-3 months ago --reports that the pain is constant esp when using hand to lift items.  Reports that he pain and swelling is in the area of the 2nd and 3 knuckles of the hand. Denies any numbness or tingling Grip strength is equal but reports some pain in that area of the left hand when grip my hand.      Current Medication:  Outpatient Encounter Medications as of 02/11/2024  Medication Sig   clonazePAM  (KLONOPIN ) 0.5 MG tablet Take one tab po qhs for hot flashes (Patient not taking: Reported on 02/11/2024)   cyanocobalamin (VITAMIN B12) 1000 MCG tablet Take 3,000 mcg by mouth daily. (Patient not taking: Reported on 02/11/2024)   cyclobenzaprine  (FLEXERIL ) 5 MG tablet TAKE 1 TABLET BY MOUTH EVERYDAY AT BEDTIME (Patient not taking: Reported on 02/11/2024)   hydrocortisone  (PROCTOSOL HC) 2.5 % rectal cream Apply 1 Application topically 2 (two) times daily.   levothyroxine  (SYNTHROID ) 25 MCG tablet Take 1 tablet (25 mcg total) by mouth daily.   pravastatin  (PRAVACHOL ) 10 MG tablet Take 1 tablet (10 mg total) by mouth daily.   triamcinolone  cream (KENALOG ) 0.1 % Apply 1 Application topically 2 (two) times daily.   valACYclovir  (VALTREX ) 500 MG tablet Take 1 tablet (500 mg total) by mouth daily.   venlafaxine  XR (EFFEXOR -XR) 37.5 MG 24 hr capsule Take 1 capsule (37.5 mg total) by mouth daily.   Vitamin D , Ergocalciferol , (DRISDOL ) 1.25 MG (50000 UNIT) CAPS capsule TAKE 1 CAPSULE BY MOUTH ONE TIME PER WEEK   Zinc 30 MG TABS Take 35 mg by mouth daily. Take 35mg  daily   No facility-administered encounter medications on file as of  02/11/2024.      Medical History: History reviewed. No pertinent past medical history.   Vital Signs: BP 120/80   Pulse 63   Temp (!) 96.2 F (35.7 C)   Resp 16   Ht 5' 3 (1.6 m)   Wt 188 lb (85.3 kg)   SpO2 97%   BMI 33.30 kg/m    Review of Systems  Constitutional:  Negative for chills, fatigue and unexpected weight change.  HENT:  Negative for congestion, postnasal drip, rhinorrhea, sneezing and sore throat.   Eyes:  Negative for redness.  Respiratory:  Negative for cough, chest tightness and shortness of breath.   Cardiovascular: Negative.  Negative for chest pain and palpitations.  Gastrointestinal:  Negative for abdominal pain, constipation, diarrhea, nausea and vomiting.  Genitourinary:  Negative for dysuria and frequency.  Musculoskeletal:  Positive for arthralgias (left hand). Negative for back pain, joint swelling and neck pain.  Skin:  Negative for rash.  Neurological: Negative.  Negative for tremors and numbness.  Hematological:  Negative for adenopathy. Does not bruise/bleed easily.  Psychiatric/Behavioral:  Negative for behavioral problems (Depression), sleep disturbance and suicidal ideas. The patient is not nervous/anxious.     Physical Exam Vitals reviewed.  Constitutional:      General: She is not in acute distress.    Appearance: Normal appearance. She is obese. She is not ill-appearing.  HENT:     Head: Normocephalic  and atraumatic.  Eyes:     Pupils: Pupils are equal, round, and reactive to light.  Cardiovascular:     Rate and Rhythm: Normal rate and regular rhythm.  Musculoskeletal:     Left hand: Swelling and tenderness present. Decreased range of motion. Decreased strength.  Skin:    General: Skin is warm and dry.  Neurological:     Mental Status: She is alert and oriented to person, place, and time.  Psychiatric:        Mood and Affect: Mood normal.        Behavior: Behavior normal.       Assessment/Plan: 1. Left hand pain  (Primary) Patient will try OTC aleve as needed for left hand pain and will call her orthopedic doctor for an appointment for further evaluation and xrays.   General Counseling: [name] verbalizes understanding of the findings of todays visit and agrees with plan of treatment. I have discussed any further diagnostic evaluation that may be needed or ordered today. We also reviewed her medications today. she has been encouraged to call the office with any questions or concerns that should arise related to todays visit.    Counseling:    No orders of the defined types were placed in this encounter.   No orders of the defined types were placed in this encounter.   Return if symptoms worsen or fail to improve, for patient will call her orthopedic doctor to schedule appt. .  [place] Controlled Substance Database was reviewed by me for overdose risk score (ORS)  Time spent:20 Minutes Time spent with patient included reviewing progress notes, labs, imaging studies, and discussing plan for follow up.   This patient was seen by Mardy Maxin, FNP-C in collaboration with Dr. Sigrid Bathe as a part of collaborative care agreement.  Jeanluc Wegman R. Maxin, MSN, FNP-C Internal Medicine

## 2024-02-12 ENCOUNTER — Encounter: Payer: Self-pay | Admitting: Nurse Practitioner

## 2024-02-23 ENCOUNTER — Encounter: Payer: 59 | Admitting: Physician Assistant

## 2024-04-01 ENCOUNTER — Telehealth: Payer: Self-pay | Admitting: Physician Assistant

## 2024-04-01 NOTE — Telephone Encounter (Signed)
 Lvm to move 04/05/2024 appointment -Andree

## 2024-04-05 ENCOUNTER — Encounter: Admitting: Physician Assistant

## 2024-04-08 ENCOUNTER — Ambulatory Visit (INDEPENDENT_AMBULATORY_CARE_PROVIDER_SITE_OTHER): Admitting: Physician Assistant

## 2024-04-08 ENCOUNTER — Encounter: Payer: Self-pay | Admitting: Physician Assistant

## 2024-04-08 VITALS — BP 125/68 | HR 82 | Temp 98.0°F | Resp 16 | Ht 63.0 in | Wt 199.6 lb

## 2024-04-08 DIAGNOSIS — Z1231 Encounter for screening mammogram for malignant neoplasm of breast: Secondary | ICD-10-CM

## 2024-04-08 DIAGNOSIS — H9193 Unspecified hearing loss, bilateral: Secondary | ICD-10-CM

## 2024-04-08 DIAGNOSIS — R300 Dysuria: Secondary | ICD-10-CM | POA: Diagnosis not present

## 2024-04-08 DIAGNOSIS — R7303 Prediabetes: Secondary | ICD-10-CM

## 2024-04-08 DIAGNOSIS — E782 Mixed hyperlipidemia: Secondary | ICD-10-CM | POA: Diagnosis not present

## 2024-04-08 DIAGNOSIS — Z0001 Encounter for general adult medical examination with abnormal findings: Secondary | ICD-10-CM

## 2024-04-08 NOTE — Progress Notes (Signed)
 " [facility] [address] [place], [place] [address]  Internal MEDICINE  Office Visit Note  Patient Name: Jo Hampton  [id number]  [id number]  Date of Service: 04/08/2024  Chief Complaint  Patient presents with   Annual Exam   Referral    ENT     HPI Pt is here for routine health maintenance examination -requests ENT for hearing evaluation, states she doesn't think she fully hears things. Has to ask people to repeat a lot and has been going on for awhile  -Taking pravastatin  daily now -pap UTD -cologuard vs colonoscopy due in June -mammogram due  Current Medication: Outpatient Encounter Medications as of 04/08/2024  Medication Sig   clonazePAM  (KLONOPIN ) 0.5 MG tablet Take one tab po qhs for hot flashes   cyanocobalamin (VITAMIN B12) 1000 MCG tablet Take 3,000 mcg by mouth daily.   cyclobenzaprine  (FLEXERIL ) 5 MG tablet TAKE 1 TABLET BY MOUTH EVERYDAY AT BEDTIME   hydrocortisone  (PROCTOSOL HC) 2.5 % rectal cream Apply 1 Application topically 2 (two) times daily.   levothyroxine  (SYNTHROID ) 25 MCG tablet Take 1 tablet (25 mcg total) by mouth daily.   pravastatin  (PRAVACHOL ) 10 MG tablet Take 1 tablet (10 mg total) by mouth daily.   triamcinolone  cream (KENALOG ) 0.1 % Apply 1 Application topically 2 (two) times daily.   valACYclovir  (VALTREX ) 500 MG tablet Take 1 tablet (500 mg total) by mouth daily.   venlafaxine  XR (EFFEXOR -XR) 37.5 MG 24 hr capsule Take 1 capsule (37.5 mg total) by mouth daily.   Vitamin D , Ergocalciferol , (DRISDOL ) 1.25 MG (50000 UNIT) CAPS capsule TAKE 1 CAPSULE BY MOUTH ONE TIME PER WEEK   Zinc 30 MG TABS Take 35 mg by mouth daily. Take 35mg  daily   No facility-administered encounter medications on file as of 04/08/2024.    Surgical History: Past Surgical History:  Procedure Laterality Date   ovarian  cyst removed      Medical History: History reviewed. No pertinent past medical history.  Family History: Family History  Problem  Relation Age of Onset   Breast cancer Mother 49   Depression Mother       Review of Systems  Constitutional:  Negative for chills, fatigue and unexpected weight change.  HENT:  Positive for hearing loss. Negative for congestion, postnasal drip, rhinorrhea, sneezing and sore throat.   Eyes:  Negative for redness.  Respiratory:  Negative for cough, chest tightness and shortness of breath.   Cardiovascular:  Negative for chest pain and palpitations.  Gastrointestinal:  Negative for abdominal pain, constipation, diarrhea, nausea and vomiting.  Genitourinary:  Negative for dysuria and frequency.  Musculoskeletal:  Negative for arthralgias, back pain, joint swelling and neck pain.  Skin:  Negative for rash.  Neurological: Negative.  Negative for tremors and numbness.  Hematological:  Negative for adenopathy. Does not bruise/bleed easily.  Psychiatric/Behavioral:  Negative for behavioral problems (Depression), sleep disturbance and suicidal ideas. The patient is not nervous/anxious.      Vital Signs: BP 125/68   Pulse 82   Temp 98 F (36.7 C)   Resp 16   Ht 5' 3 (1.6 m)   Wt 199 lb 9.6 oz (90.5 kg)   SpO2 98%   BMI 35.36 kg/m    Physical Exam Vitals and nursing note reviewed.  Constitutional:      Appearance: Normal appearance.  HENT:     Head: Normocephalic and atraumatic.  Eyes:     Extraocular Movements: Extraocular movements intact.  Cardiovascular:  Rate and Rhythm: Normal rate and regular rhythm.     Pulses: Normal pulses.     Heart sounds: Normal heart sounds.  Pulmonary:     Effort: Pulmonary effort is normal.     Breath sounds: Normal breath sounds.  Abdominal:     Tenderness: There is no abdominal tenderness.  Musculoskeletal:        General: Normal range of motion.  Skin:    General: Skin is warm and dry.  Neurological:     General: No focal deficit present.     Mental Status: She is alert.  Psychiatric:        Mood and Affect: Mood normal.         Behavior: Behavior normal.      LABS: Recent Results (from the past 2160 hours)  UA/M w/rflx Culture, Routine     Status: Abnormal   Collection Time: 04/08/24  3:35 PM   Specimen: Urine   Urine  Result Value Ref Range   Specific Gravity, UA 1.005 1.005 - 1.030   pH, UA 7.0 5.0 - 7.5   Color, UA Yellow Yellow   Appearance Ur Clear Clear   Leukocytes,UA Negative Negative   Protein,UA Negative Negative/Trace   Glucose, UA Negative Negative   Ketones, UA Negative Negative   RBC, UA 3+ (A) Negative   Bilirubin, UA Negative Negative   Urobilinogen, Ur 0.2 0.2 - 1.0 mg/dL   Nitrite, UA Negative Negative   Microscopic Examination See below:     Comment: Microscopic was indicated and was performed.   Urinalysis Reflex Comment     Comment: This specimen will not reflex to a Urine Culture.  Microscopic Examination     Status: None   Collection Time: 04/08/24  3:35 PM   Urine  Result Value Ref Range   WBC, UA None seen 0 - 5 /hpf   RBC, Urine 0-2 0 - 2 /hpf   Epithelial Cells (non renal) None seen 0 - 10 /hpf   Casts None seen None seen /lpf   Bacteria, UA None seen None seen/Few  Lipid Panel With LDL/HDL Ratio     Status: Abnormal   Collection Time: 04/12/24  2:56 PM  Result Value Ref Range   Cholesterol, Total 188 100 - 199 mg/dL   Triglycerides 897 0 - 149 mg/dL   HDL 34 (L) >60 mg/dL   VLDL Cholesterol Cal 19 5 - 40 mg/dL   LDL Chol Calc (NIH) 864 (H) 0 - 99 mg/dL   LDL/HDL Ratio 4.0 (H) 0.0 - 3.2 ratio    Comment:                                     LDL/HDL Ratio                                             Men  Women                               1/2 Avg.Risk  1.0    1.5                                   Avg.Risk  3.6    3.2                                2X Avg.Risk  6.2    5.0                                3X Avg.Risk  8.0    6.1   Hgb A1C w/o eAG     Status: Abnormal   Collection Time: 04/12/24  2:56 PM  Result Value Ref Range   Hgb A1c MFr Bld 5.8 (H) 4.8 - 5.6 %     Comment:          Prediabetes: 5.7 - 6.4          Diabetes: >6.4          Glycemic control for adults with diabetes: <7.0         Assessment/Plan: 1. Encounter for general adult medical examination with abnormal findings (Primary) CPE performed, due for mammogram  2. Decreased hearing of both ears Will refer to ENT for further evaluation - Ambulatory referral to ENT  3. Visit for screening mammogram - MM 3D SCREENING MAMMOGRAM BILATERAL BREAST; Future  4. Prediabetes - Hgb A1C w/o eAG  5. Mixed hyperlipidemia Continue pravastatin  and will update labs - Lipid Panel With LDL/HDL Ratio  6. Dysuria - UA/M w/rflx Culture, Routine   General Counseling: Jo Hampton verbalizes understanding of the findings of todays visit and agrees with plan of treatment. I have discussed any further diagnostic evaluation that may be needed or ordered today. We also reviewed her medications today. she has been encouraged to call the office with any questions or concerns that should arise related to todays visit.    Counseling:    Orders Placed This Encounter  Procedures   Microscopic Examination   MM 3D SCREENING MAMMOGRAM BILATERAL BREAST   Lipid Panel With LDL/HDL Ratio   Hgb A1C w/o eAG   UA/M w/rflx Culture, Routine   Ambulatory referral to ENT    No orders of the defined types were placed in this encounter.   This patient was seen by Tinnie Pro, PA-C in collaboration with Dr. Sigrid Bathe as a part of collaborative care agreement.  Total time spent:35 Minutes  Time spent includes review of chart, medications, test results, and follow up plan with the patient.     Sigrid CHRISTELLA Bathe, MD  Internal Medicine  "

## 2024-04-09 ENCOUNTER — Telehealth: Payer: Self-pay | Admitting: Physician Assistant

## 2024-04-09 LAB — UA/M W/RFLX CULTURE, ROUTINE
Bilirubin, UA: NEGATIVE
Glucose, UA: NEGATIVE
Ketones, UA: NEGATIVE
Leukocytes,UA: NEGATIVE
Nitrite, UA: NEGATIVE
Protein,UA: NEGATIVE
Specific Gravity, UA: 1.005 (ref 1.005–1.030)
Urobilinogen, Ur: 0.2 mg/dL (ref 0.2–1.0)
pH, UA: 7 (ref 5.0–7.5)

## 2024-04-09 LAB — MICROSCOPIC EXAMINATION
Bacteria, UA: NONE SEEN
Casts: NONE SEEN /LPF
Epithelial Cells (non renal): NONE SEEN /HPF (ref 0–10)
WBC, UA: NONE SEEN /HPF (ref 0–5)

## 2024-04-09 NOTE — Telephone Encounter (Signed)
 Awaiting 04/08/24 office notes for Otolaryngology referral-Toni

## 2024-04-13 ENCOUNTER — Ambulatory Visit: Payer: Self-pay | Admitting: Physician Assistant

## 2024-04-13 ENCOUNTER — Telehealth: Payer: Self-pay | Admitting: Physician Assistant

## 2024-04-13 LAB — LIPID PANEL WITH LDL/HDL RATIO
Cholesterol, Total: 188 mg/dL (ref 100–199)
HDL: 34 mg/dL — ABNORMAL LOW
LDL Chol Calc (NIH): 135 mg/dL — ABNORMAL HIGH (ref 0–99)
LDL/HDL Ratio: 4 ratio — ABNORMAL HIGH (ref 0.0–3.2)
Triglycerides: 102 mg/dL (ref 0–149)
VLDL Cholesterol Cal: 19 mg/dL (ref 5–40)

## 2024-04-13 LAB — HGB A1C W/O EAG: Hgb A1c MFr Bld: 5.8 % — ABNORMAL HIGH (ref 4.8–5.6)

## 2024-04-13 NOTE — Progress Notes (Signed)
Pt notified for labs result  

## 2024-04-13 NOTE — Telephone Encounter (Signed)
 Otolaryngology referral sent via [facility] to [facility]. Telephone # [phone]. Left patient vm-Toni

## 2024-10-06 ENCOUNTER — Ambulatory Visit: Admitting: Physician Assistant

## 2025-04-11 ENCOUNTER — Encounter: Admitting: Physician Assistant
